# Patient Record
Sex: Female | Born: 1965
Health system: Southern US, Community
[De-identification: ages and names within clinical notes are randomized; demographics above are authoritative.]

## PROBLEM LIST (undated history)

## (undated) DIAGNOSIS — E785 Hyperlipidemia, unspecified: Secondary | ICD-10-CM

## (undated) DIAGNOSIS — E119 Type 2 diabetes mellitus without complications: Secondary | ICD-10-CM

---

## 2003-03-16 ENCOUNTER — Encounter (INDEPENDENT_AMBULATORY_CARE_PROVIDER_SITE_OTHER): Payer: Self-pay

## 2003-03-16 ENCOUNTER — Encounter: Payer: Self-pay | Admitting: Emergency Medicine

## 2003-03-16 ENCOUNTER — Observation Stay (HOSPITAL_COMMUNITY): Admission: AD | Admit: 2003-03-16 | Discharge: 2003-03-16 | Payer: Self-pay | Admitting: Obstetrics and Gynecology

## 2004-11-19 ENCOUNTER — Ambulatory Visit: Payer: Self-pay | Admitting: Sports Medicine

## 2005-01-02 ENCOUNTER — Ambulatory Visit: Payer: Self-pay | Admitting: Family Medicine

## 2005-01-10 ENCOUNTER — Ambulatory Visit (HOSPITAL_COMMUNITY): Admission: RE | Admit: 2005-01-10 | Discharge: 2005-01-10 | Payer: Self-pay | Admitting: Family Medicine

## 2005-01-21 ENCOUNTER — Ambulatory Visit: Payer: Self-pay | Admitting: Family Medicine

## 2005-02-27 ENCOUNTER — Ambulatory Visit: Payer: Self-pay | Admitting: Family Medicine

## 2005-04-25 ENCOUNTER — Ambulatory Visit: Payer: Self-pay | Admitting: Family Medicine

## 2005-07-08 ENCOUNTER — Ambulatory Visit: Payer: Self-pay | Admitting: Family Medicine

## 2005-08-09 ENCOUNTER — Ambulatory Visit: Payer: Self-pay | Admitting: Family Medicine

## 2005-11-07 ENCOUNTER — Encounter (INDEPENDENT_AMBULATORY_CARE_PROVIDER_SITE_OTHER): Payer: Self-pay | Admitting: *Deleted

## 2005-11-21 ENCOUNTER — Ambulatory Visit: Payer: Self-pay | Admitting: Family Medicine

## 2005-11-22 ENCOUNTER — Ambulatory Visit (HOSPITAL_COMMUNITY): Admission: RE | Admit: 2005-11-22 | Discharge: 2005-11-22 | Payer: Self-pay | Admitting: Sports Medicine

## 2005-12-09 ENCOUNTER — Ambulatory Visit: Payer: Self-pay | Admitting: Sports Medicine

## 2005-12-27 ENCOUNTER — Inpatient Hospital Stay (HOSPITAL_COMMUNITY): Admission: AD | Admit: 2005-12-27 | Discharge: 2005-12-27 | Payer: Self-pay | Admitting: Obstetrics & Gynecology

## 2006-01-08 ENCOUNTER — Ambulatory Visit (HOSPITAL_COMMUNITY): Admission: RE | Admit: 2006-01-08 | Discharge: 2006-01-08 | Payer: Self-pay | Admitting: Sports Medicine

## 2006-01-14 ENCOUNTER — Ambulatory Visit: Payer: Self-pay | Admitting: Family Medicine

## 2006-02-14 ENCOUNTER — Ambulatory Visit: Payer: Self-pay | Admitting: Family Medicine

## 2006-02-17 ENCOUNTER — Ambulatory Visit: Payer: Self-pay | Admitting: Family Medicine

## 2006-02-27 ENCOUNTER — Encounter: Admission: RE | Admit: 2006-02-27 | Discharge: 2006-02-27 | Payer: Self-pay | Admitting: Family Medicine

## 2006-03-04 ENCOUNTER — Ambulatory Visit: Payer: Self-pay | Admitting: Family Medicine

## 2006-03-06 ENCOUNTER — Ambulatory Visit (HOSPITAL_COMMUNITY): Admission: RE | Admit: 2006-03-06 | Discharge: 2006-03-06 | Payer: Self-pay | Admitting: Family Medicine

## 2006-03-06 ENCOUNTER — Ambulatory Visit: Payer: Self-pay | Admitting: Family Medicine

## 2006-03-10 ENCOUNTER — Ambulatory Visit: Payer: Self-pay | Admitting: Family Medicine

## 2006-03-17 ENCOUNTER — Ambulatory Visit (HOSPITAL_COMMUNITY): Admission: RE | Admit: 2006-03-17 | Discharge: 2006-03-17 | Payer: Self-pay | Admitting: Obstetrics & Gynecology

## 2006-03-17 ENCOUNTER — Ambulatory Visit: Payer: Self-pay | Admitting: Gynecology

## 2006-03-19 ENCOUNTER — Ambulatory Visit: Payer: Self-pay | Admitting: Family Medicine

## 2006-03-31 ENCOUNTER — Ambulatory Visit: Payer: Self-pay | Admitting: *Deleted

## 2006-04-07 ENCOUNTER — Ambulatory Visit: Payer: Self-pay | Admitting: Obstetrics & Gynecology

## 2006-04-14 ENCOUNTER — Ambulatory Visit: Payer: Self-pay | Admitting: Family Medicine

## 2006-04-17 ENCOUNTER — Ambulatory Visit: Payer: Self-pay | Admitting: Obstetrics and Gynecology

## 2006-04-21 ENCOUNTER — Ambulatory Visit: Payer: Self-pay | Admitting: Family Medicine

## 2006-04-24 ENCOUNTER — Ambulatory Visit: Payer: Self-pay | Admitting: Family Medicine

## 2006-04-28 ENCOUNTER — Ambulatory Visit: Payer: Self-pay | Admitting: Obstetrics & Gynecology

## 2006-05-01 ENCOUNTER — Ambulatory Visit: Payer: Self-pay | Admitting: Family Medicine

## 2006-05-05 ENCOUNTER — Ambulatory Visit: Payer: Self-pay | Admitting: Gynecology

## 2006-05-08 ENCOUNTER — Ambulatory Visit (HOSPITAL_COMMUNITY): Admission: RE | Admit: 2006-05-08 | Discharge: 2006-05-08 | Payer: Self-pay | Admitting: Family Medicine

## 2006-05-08 ENCOUNTER — Ambulatory Visit: Payer: Self-pay | Admitting: Family Medicine

## 2006-05-12 ENCOUNTER — Ambulatory Visit: Payer: Self-pay | Admitting: Family Medicine

## 2006-05-12 ENCOUNTER — Ambulatory Visit (HOSPITAL_COMMUNITY): Admission: RE | Admit: 2006-05-12 | Discharge: 2006-05-12 | Payer: Self-pay | Admitting: Obstetrics & Gynecology

## 2006-05-15 ENCOUNTER — Ambulatory Visit: Payer: Self-pay | Admitting: Family Medicine

## 2006-05-18 ENCOUNTER — Ambulatory Visit: Payer: Self-pay | Admitting: Obstetrics and Gynecology

## 2006-05-18 ENCOUNTER — Inpatient Hospital Stay (HOSPITAL_COMMUNITY): Admission: AD | Admit: 2006-05-18 | Discharge: 2006-05-22 | Payer: Self-pay | Admitting: Obstetrics and Gynecology

## 2006-05-19 ENCOUNTER — Encounter (INDEPENDENT_AMBULATORY_CARE_PROVIDER_SITE_OTHER): Payer: Self-pay | Admitting: *Deleted

## 2006-05-27 ENCOUNTER — Ambulatory Visit: Admission: RE | Admit: 2006-05-27 | Discharge: 2006-05-27 | Payer: Self-pay | Admitting: Obstetrics & Gynecology

## 2006-07-03 ENCOUNTER — Ambulatory Visit: Payer: Self-pay | Admitting: Sports Medicine

## 2006-10-09 ENCOUNTER — Ambulatory Visit: Payer: Self-pay | Admitting: Family Medicine

## 2006-11-11 ENCOUNTER — Ambulatory Visit: Payer: Self-pay | Admitting: Family Medicine

## 2006-12-05 ENCOUNTER — Encounter (INDEPENDENT_AMBULATORY_CARE_PROVIDER_SITE_OTHER): Payer: Self-pay | Admitting: *Deleted

## 2007-01-21 ENCOUNTER — Ambulatory Visit: Payer: Self-pay | Admitting: Family Medicine

## 2007-01-21 DIAGNOSIS — N970 Female infertility associated with anovulation: Secondary | ICD-10-CM

## 2007-02-24 ENCOUNTER — Ambulatory Visit: Payer: Self-pay | Admitting: Family Medicine

## 2007-02-24 DIAGNOSIS — L723 Sebaceous cyst: Secondary | ICD-10-CM

## 2007-03-03 ENCOUNTER — Ambulatory Visit: Payer: Self-pay | Admitting: Family Medicine

## 2007-04-01 ENCOUNTER — Encounter (INDEPENDENT_AMBULATORY_CARE_PROVIDER_SITE_OTHER): Payer: Self-pay | Admitting: Family Medicine

## 2007-04-01 ENCOUNTER — Ambulatory Visit: Payer: Self-pay | Admitting: Family Medicine

## 2007-04-02 ENCOUNTER — Encounter (INDEPENDENT_AMBULATORY_CARE_PROVIDER_SITE_OTHER): Payer: Self-pay | Admitting: Family Medicine

## 2007-04-02 LAB — CONVERTED CEMR LAB: Pap Smear: NORMAL

## 2007-04-06 ENCOUNTER — Encounter (INDEPENDENT_AMBULATORY_CARE_PROVIDER_SITE_OTHER): Payer: Self-pay | Admitting: Family Medicine

## 2007-12-28 ENCOUNTER — Ambulatory Visit: Payer: Self-pay | Admitting: Family Medicine

## 2007-12-28 DIAGNOSIS — N979 Female infertility, unspecified: Secondary | ICD-10-CM | POA: Insufficient documentation

## 2008-04-27 ENCOUNTER — Ambulatory Visit: Payer: Self-pay | Admitting: Family Medicine

## 2008-04-27 ENCOUNTER — Encounter (INDEPENDENT_AMBULATORY_CARE_PROVIDER_SITE_OTHER): Payer: Self-pay | Admitting: Family Medicine

## 2008-04-27 ENCOUNTER — Telehealth (INDEPENDENT_AMBULATORY_CARE_PROVIDER_SITE_OTHER): Payer: Self-pay | Admitting: Family Medicine

## 2008-04-27 LAB — CONVERTED CEMR LAB
Free T4: 1.11 ng/dL (ref 0.89–1.80)
T3, Free: 2.9 pg/mL (ref 2.3–4.2)
TSH: 2.196 microintl units/mL (ref 0.350–4.50)

## 2008-04-28 LAB — CONVERTED CEMR LAB: Pap Smear: NORMAL

## 2008-05-04 ENCOUNTER — Encounter (INDEPENDENT_AMBULATORY_CARE_PROVIDER_SITE_OTHER): Payer: Self-pay | Admitting: Family Medicine

## 2008-05-05 ENCOUNTER — Ambulatory Visit (HOSPITAL_COMMUNITY): Admission: RE | Admit: 2008-05-05 | Discharge: 2008-05-05 | Payer: Self-pay | Admitting: Anesthesiology

## 2008-07-20 ENCOUNTER — Ambulatory Visit: Payer: Self-pay | Admitting: Family Medicine

## 2010-10-28 ENCOUNTER — Encounter: Payer: Self-pay | Admitting: *Deleted

## 2010-11-30 ENCOUNTER — Encounter: Payer: Self-pay | Admitting: *Deleted

## 2011-02-22 NOTE — Discharge Summary (Signed)
NAME:  Carly [name], Carly [name]       ACCOUNT NO.:  [id number]   MEDICAL RECORD NO.:  [id number]          PATIENT TYPE:  WOC   LOCATION:  WOC                          FACILITY:  [facility]   PHYSICIAN:  Deirdre Poe, C.N.M.    DATE OF BIRTH:  03/09/1966   DATE OF ADMISSION:  05/18/2006  DATE OF DISCHARGE:                                 DISCHARGE SUMMARY   ADMISSION DIAGNOSES:  54. A 45 year old term gestation, in labor.  2. Gestational diabetes.   DISCHARGE DIAGNOSES:  1. Primary low transverse cesarean section viable female.  2. Breast engorgement.  3. Normoglycemic.   HISTORY:  This is a 45 year old G2, P-0-0-1-0, presented at 39-3/7th's weeks  by good dating criteria with a scheduled appointment for induction of labor  who had a favorable cervix of 1-2 60% -1, but was not in a labor pattern  contraction.  Fetal heart rate was reassuring.   OBSTETRIC HISTORY:  She was followed at [facility] and high-  risk clinic.  Risk factors were advanced maternal age and A2 gestational  diabetes with good control on Glyburide low dosage.  She is also on prenatal  vitamins.   SIGNIFICANT LABS:  O positive, antibody screen negative, hemoglobin 12.9,  GBS negative, 716.  Her Glucola was over 200, and she was managed with home  CBGs and Glyburide.   Medical, surgical history, family history all noncontributory.   SOCIAL HISTORY:  Nonsmoker.   ADMISSION EXAM:  VITAL SIGNS:  Stable.  BP 120/72,  HEART:  Regular rate and rhythm.  LUNGS:  Clear.  CERVIX:  Exam as above.   HOSPITAL COURSE:  Patient was admitted and begun on Cytotec for cervical  ripening.  She did get further cervical dilatation after that to 2 to 350  and -1, and at this point, membranes were ruptured with clear fluid found.  She was noted, however, to have some variable decelerations, and Dr.  Mia Creek evaluated this, and the decision was made after counseling the  family to proceed with LTCS, due to  non-reassuring fetal heart rate.  The  post-op diagnosis was also inclusive of an occult cord prolapse.  BPL was  500, Apgars were 9, 9 and placenta went to pathology.  Postoperatively, she  did well other than problems with breast feeding.  Her hemoglobin was 46.1  on post-op day 1.  By post-op day 3, she had engorgement particularly of her  right breast and was breast and bottle feeding, as well as pumping.  She had  multiple visits with lactation consultant, and prior to discharge was breast  feeding well.  Her staples were removed on post-op day 3, and she was  discharged home on prenatal vitamin 1 a  day, Micronor 28 to start the next Sunday, ibuprofen 600 p.o. q.6 h.,  Percocet 1-2 q.4-6 h. p.r.n. for pain.  Her followup was to be at [facility] with Dr. Alfonse Ras, who is her primary care physician.  Follow  up in 6 weeks.      Carly [name], C.N.M.     DP/MEDQ  D:  05/22/2006  T:  05/22/2006  Job:  [id number] 

## 2011-02-22 NOTE — Op Note (Signed)
NAME:  Carly [name], Carly [name]                        ACCOUNT NO.:  [id number]   MEDICAL RECORD NO.:  [id number]                   PATIENT TYPE:  AMB   LOCATION:  SDC                                  [facility]  [facility]   PHYSICIAN:  Sherry A. Rosalio Macadamia, M.D.           DATE OF BIRTH:  18-Feb-1966   DATE OF PROCEDURE:  03/16/2003  DATE OF DISCHARGE:                                 OPERATIVE REPORT   PREOPERATIVE DIAGNOSIS:  Ruptured ectopic pregnancy.   POSTOPERATIVE DIAGNOSIS:  Ruptured ectopic pregnancy.   PROCEDURE:  1. Diagnostic open laparoscopy.  2. Removal of left ectopic pregnancy.  3. Left partial salpingectomy.  4. Removal of hemoperitoneum.   SURGEON:  Sherry A. Rosalio Macadamia, M.D.   ANESTHESIA:  General.   INDICATIONS:  This is a 45 year old G2, P-0-0-2-0 woman, who presented to  the emergency room at [facility] with 10 days of bleeding and  severe abdominal pain.  The patient complained of worsening pain over the  prior day, with severe shoulder pain.  The patient was seen in the emergency  room, where she essentially presented in shock with a low blood pressure.  The patient was treated with IV fluids, then treated with two units of  packed red cells.  Ultrasound was performed, which revealed empty uterus  with significant fluid in the abdominal cavity, positive pregnancy test was  present; because of this a diagnosis of ruptured ectopic pregnancy was made.  The patient stabilized very quickly with IV fluids and blood transfusion.  She was therefore transferred to [facility] by [facility].  On admission  at [facility] her blood pressure and pulse were stable.  She was  evaluated and prepared for surgery.   FINDINGS:  Normal sized anteflexed uterus.  Normal ovaries bilaterally.  Normal right fallopian tube.  Left fallopian tube with isthmic 2-3 cm  ectopic pregnancy, partially ruptured with bleeding present.  Large  hemoperitoneum, with approximately 1500 cc  of blood in the abdominal cavity  at time of surgery.   DESCRIPTION OF PROCEDURE:  The patient was brought into the operating room  and given adequate general anesthesia.  She was placed in a dorsal lithotomy  position.  Her abdomen and perineum were washed with Betadine.  The vagina  was washed with Betadine.  A speculum was placed within the vagina.  A  single-toothed Hulka tenaculum was placed in the endometrial cavity in the  anterior fashion.  Speculum was removed.  The surgeon's gown and gloves were  changed.  The patient is draped in a sterile fashion.  Subumbilical area was  incised.  The incision was brought down to the fascia.  The fascia was  grasped with Kocher clamps.  The fascia was incised.  Fascia was  pursestringed with 0 Vicryl suture.  The peritoneum was identified and  entered bluntly.  Hasson trocar was introduced into the peritoneal cavity.  The suprapubic area was infiltrated with  0.5% Marcaine and the incision was  made.  A trocar was placed under direct visualization.  The pelvis was  inspected, with a large amount of blood present (as described above).  The  ectopic was seen easily in the left fallopian tube.  A left lateral mid  incision was made, after 0.5% Marcaine infiltrated, and a 5 mm trocar was  introduced under direct visualization.  Using tripolar cautery, the  fallopian tube was cauterized on either side of the ectopic, and cauterized  across the mesosalpinx.  The cautery and cutting were performed.  Adequate  hemostasis was present.  The specimen was then rested in the cul-de-sac.  Some of the hemoperitoneum was removed for better visualization.  A 5 mm  scope was placed in the suprapubic site.  An Endo catch bag was placed  through the umbilical site.  The specimen was placed in the Endo catch bag  and removed through the umbilical incision.  A regular laparotomy pull  sucker was then placed through the umbilical incision; a large amount of  blood  was removed.  The Hasson was replaced through the umbilical incision,  and the large scope was replaced in the umbilical port.  Using the [place],  the remainder of the hemoperitoneum was removed with some irrigation.  Not  all of the blood could be removed because of some blood clotting and just  difficulty maneuvering around the bowel.  It is estimated that approximately  1500 cc of the blood was removed.  Adequate hemostasis was present and  pictures were obtained.  The left trocar was removed under direct  visualization.  The Hasson was removed, and all carbon dioxide was  encouraged to escape through the umbilical incision in the suprapubic port.  Then the suprapubic sleeve was removed.  The fascia was closed with the  pursestring stitch that was present.  The umbilical incision was then closed  with 4-0 Monocryl in a subcuticular running stitch.  Durabond was placed on  all three incisions.  Adequate hemostasis was present.  A bandage was placed  over the umbilical incision as well.  The Hulka tenaculum was removed.  The  patient was taken out of the dorsal lithotomy position.  She was awakened  and she was moved from the operating room table to a stretcher in stable  condition.   COMPLICATIONS:  None.   ESTIMATED BLOOD LOSS:  1500 cc prior to surgery.                                               Sherry A. Rosalio Macadamia, M.D.    SAD/MEDQ  D:  03/16/2003  T:  03/16/2003  Job:  [id number]

## 2011-02-22 NOTE — Op Note (Signed)
NAME:  Carly [name], Carly [name]       ACCOUNT NO.:  [id number]   MEDICAL RECORD NO.:  [id number]          PATIENT TYPE:  INP   LOCATION:  WOC                           [facility]   PHYSICIAN:  Phil D. Okey Dupre, M.D.     DATE OF BIRTH:  Jun 20, 1966   DATE OF PROCEDURE:  05/19/2006  DATE OF DISCHARGE:                                 OPERATIVE REPORT   PROCEDURE:  Low transverse cesarean section.   PREOPERATIVE DIAGNOSIS:  Nonreassuring fetal heart pattern.   POSTOPERATIVE DIAGNOSIS:  Nonreassuring fetal heart pattern, occult prolapse  of the umbilical cord.   ESTIMATED BLOOD LOSS:  500 mL.   POSTOPERATIVE CONDITION:  Satisfactory.   SPECIMENS TO PATHOLOGY:  The placenta.   SURGEON:  Dr. Elinor Dodge.   ANESTHESIA:  Spinal.   POSTOPERATIVE CONDITION:  Satisfactory.   PROCEDURE:  Under satisfactory spinal anesthesia with the patient in dorsal  supine position, a Foley catheter in urinary bladder, the abdomen was  prepped and draped in usual sterile manner and entered through a  Pfannenstiel incision situated 3 cm above the symphysis pubis and extending  for a total length 14 cm.  The abdomen was entered by layers.  On entering  the peritoneal cavity, the visceral peritoneum and the anterior surface of  the uterus was opened transversely by sharp dissection, the bladder pushed  away the lower uterine segment which was entered by sharp and blunt  dissection and from a ROP presentation, the baby's head was delivered.  At  that point we saw the loops of cord that were caught between the baby's left  shoulder and the neck and head.  Baby was easily delivered.  The cord doubly  clamped, divided, baby handed to pediatrician.  Samples of blood taken from  the cord for analysis.  Placenta spontaneously removed.  The uterus  explored.  The uterus was then closed with continuous running locked 0  Vicryl on an atraumatic needle.  Area was observed for bleeding, none was  noted.  The pelvis was  irrigated and the fascia closed with continuous  running 0 Vicryl on an atraumatic needle.  Subcutaneous bleeders were  controlled with hot cautery.  Skin staples for skin edge approximation.  Dry  sterile dressings applied and the patient transferred to recovery room in  satisfactory condition having tolerated the procedure well.           ______________________________  Javier Glazier. Okey Dupre, M.D.     PDR/MEDQ  D:  05/19/2006  T:  05/19/2006  Job:  [id number]

## 2012-02-11 ENCOUNTER — Ambulatory Visit: Payer: Self-pay | Admitting: Family Medicine

## 2012-03-06 ENCOUNTER — Other Ambulatory Visit (HOSPITAL_COMMUNITY)
Admission: RE | Admit: 2012-03-06 | Discharge: 2012-03-06 | Disposition: A | Discharge status: Home / Self Care | Payer: 59 | Source: Ambulatory Visit | Attending: Family Medicine | Admitting: Family Medicine

## 2012-03-06 ENCOUNTER — Encounter: Payer: Self-pay | Admitting: Family Medicine

## 2012-03-06 ENCOUNTER — Ambulatory Visit (INDEPENDENT_AMBULATORY_CARE_PROVIDER_SITE_OTHER): Payer: 59 | Admitting: Family Medicine

## 2012-03-06 VITALS — BP 122/70 | HR 60 | Temp 98.3°F | Ht 60.6 in | Wt 144.0 lb

## 2012-03-06 DIAGNOSIS — Z124 Encounter for screening for malignant neoplasm of cervix: Secondary | ICD-10-CM | POA: Insufficient documentation

## 2012-03-06 DIAGNOSIS — Z1239 Encounter for other screening for malignant neoplasm of breast: Secondary | ICD-10-CM

## 2012-03-06 DIAGNOSIS — R5383 Other fatigue: Secondary | ICD-10-CM

## 2012-03-06 DIAGNOSIS — Z01419 Encounter for gynecological examination (general) (routine) without abnormal findings: Secondary | ICD-10-CM | POA: Insufficient documentation

## 2012-03-06 DIAGNOSIS — R5381 Other malaise: Secondary | ICD-10-CM

## 2012-03-06 DIAGNOSIS — E781 Pure hyperglyceridemia: Secondary | ICD-10-CM

## 2012-03-06 NOTE — Assessment & Plan Note (Signed)
Patient with generalized body aches and fatigue, mostly affecting shoulders, elbows, somewhat less in wrists.  No edema/erythema or limitations in ROM. She has had some workup with Dr Quintella Reichert on [facility], will request records/labs before engaging in workup. Consider PHQ9 at next visit in 1 month.

## 2012-03-06 NOTE — Patient Instructions (Signed)
Fue un Research officer, trade union. Le hago contacto con el resultado del papanicolau.   Quiero ver los [employer] estudios de Dr Quintella Reichert antes de tomar una decision sobre otros estudios que hay que hacer para el cansancio muscular y los dolores de [place].  Por favor traiga los frascos de todas las medicinas que ha tomado a la proxima cita en un mes.   FOLLOW UP WITH DR Mauricio Po IN 3 to 5 WEEKS>

## 2012-03-06 NOTE — Progress Notes (Signed)
  Subjective:    Patient ID: Carly [name], female    DOB: 1966/10/06, 46 y.o.   MRN: [id number]  HPI Patient comes in to re-establish care.  Visit conducted in Spanish.   Patient with chief complaint of generalized malaise, "dolor de huesos generalizado", mostly affecting bilateral shoulders/upper arms, over several weeks.  She was seen by Dr. Quintella Reichert in [address] and diagnosed with elevated triglycerides (told they were around 500), started on medications for this. Also given an injection which she believes has helped her generalized malaise/achy pain. Associated generalized mild headache pain as well. Has tried to eat less starch and fat in the past 3 weeks, feels better.  Given a syrup that was too strong and made her sleepy after 3 days, so she stopped taking it.   Would like pap smear, last PAP was normal in March 2011 at cancer screening in [facility].  Never had an abnormal PAP.  Mother had cervical cancer.  Patient's LMP April 26, has menses approximately once monthly and believes her next menses is due in next few days.   Family Hx Brother with adult-onset DM.   Review of Systemssee above.      Objective:   Physical Exam Well appearing, no apparent distress.  HEENT Neck supple. Full active ROM neck. No point tenderness over cervical spine, thoracic spine, shoulders, elbows, wrists. No active synovitis in hands bilat. Full symmetric hand grip bilaterally. Palpable radial pulses bilaterally. Full active ROM shoulders and elbows/wrists. Thyroid supple, non-nodular.        Assessment & Plan:

## 2012-03-06 NOTE — Assessment & Plan Note (Signed)
Patient reports she had hypertriglyceridemia diagnosed by labs at Dr [facility] office in April 2013; unspecified medications. Told she does not have DM. Brother with adult-onset DM, no other family members.  She is reducing tortillas from 46 yo 2/day, walking more, eating more fiber, taking fish oil and an unspecified med for cholesterol.  WIll ask her to bring in meds and get lab values before next visit in 1 month.

## 2012-03-10 ENCOUNTER — Encounter: Payer: Self-pay | Admitting: Family Medicine

## 2012-04-03 ENCOUNTER — Ambulatory Visit (INDEPENDENT_AMBULATORY_CARE_PROVIDER_SITE_OTHER): Payer: 59 | Admitting: Family Medicine

## 2012-04-03 ENCOUNTER — Encounter: Payer: Self-pay | Admitting: Family Medicine

## 2012-04-03 VITALS — BP 123/75 | HR 60 | Ht 66.0 in | Wt 140.0 lb

## 2012-04-03 DIAGNOSIS — E781 Pure hyperglyceridemia: Secondary | ICD-10-CM

## 2012-04-03 DIAGNOSIS — R5383 Other fatigue: Secondary | ICD-10-CM

## 2012-04-03 MED ORDER — FENOFIBRATE 160 MG PO TABS: 160.0000 mg | ORAL_TABLET | Freq: Every day | ORAL | Status: DC

## 2012-04-03 NOTE — Progress Notes (Signed)
  Subjective:    Patient ID: Arlisha Patalano, female    DOB: 18-Feb-1966, 46 y.o.   MRN: [id number]  HPI Visit conducted in Spanish.  Patient here for follow up. Says her body aches are almost completely gone.  No more headaches.  Brings in a host of meds that had been given by Dr. Quintella Reichert, not taking any of them now except the fenofibrate 160mg  daily.   She states she is very stressed about situation with her brother, who has taken up with another family and left his wife and their children.  She says, "I assume other people's problems as my own." Feels bad for her brother's children, who "did nothing to deserve this."  Has lost 7 lbs weight by eliminating grease and starch from diet.  Feels very good about this.   Labs from Dr [facility] office (done 02/12/2012): WBC 8.6, Hgb 12.4, plt 279K;  Na+141, K+4.4, Cr 0.76, BUN 18; Glucose 94; Total chol 182, HDL 30, TG 494, Albumin 4.0, AST 14, ALT 24.   Review of Systems Denies chest pain or abdominal pain. No nausea or vomiting.     Objective:   Physical Exam well appearing, no apparent distress         Assessment & Plan:

## 2012-04-03 NOTE — Patient Instructions (Addendum)
Fue un placer verle hoy  Me alegro que esta' mejor desde punto de vista de los dolores corporales y de [place].   Le llamo [phone] con los [place].

## 2012-04-03 NOTE — Assessment & Plan Note (Signed)
Labs from Dr Quintella Reichert reviewed; patient is due for repeat labs today, 8 weeks after starting treatment.  She has lost weight with signifcant dietary changes.  She is given encouragement about this. To contact back by phone with results of the labs.

## 2012-04-03 NOTE — Assessment & Plan Note (Signed)
Much improved; she credits the fibrate medication with this, I would ascribe it mostly to her initiative and success in lifestyle changes.  Either way, to recheck her lipids today, modify treatment as needed. Will assess LDL as she is fasting today (full lipid panel).

## 2012-04-04 LAB — LIPID PANEL
HDL: 47 mg/dL (ref >39)
LDL Cholesterol: 57 mg/dL (ref 0–99)
Total CHOL/HDL Ratio: 2.5 Ratio
Triglycerides: 69 mg/dL (ref <150)

## 2012-04-04 LAB — COMPREHENSIVE METABOLIC PANEL
AST: 17 U/L (ref 0–37)
Alkaline Phosphatase: 46 U/L (ref 39–117)
CO2: 22 mEq/L (ref 19–32)
Calcium: 9.7 mg/dL (ref 8.4–10.5)
Chloride: 104 mEq/L (ref 96–112)
Potassium: 4 mEq/L (ref 3.5–5.3)
Total Protein: 7.1 g/dL (ref 6.0–8.3)

## 2012-04-06 ENCOUNTER — Encounter: Payer: Self-pay | Admitting: Family Medicine

## 2012-05-01 ENCOUNTER — Encounter: Payer: Self-pay | Admitting: Family Medicine

## 2012-05-01 ENCOUNTER — Ambulatory Visit (INDEPENDENT_AMBULATORY_CARE_PROVIDER_SITE_OTHER): Payer: 59 | Admitting: Family Medicine

## 2012-05-01 VITALS — BP 109/69 | HR 58 | Ht 66.0 in | Wt 138.7 lb

## 2012-05-01 DIAGNOSIS — E781 Pure hyperglyceridemia: Secondary | ICD-10-CM

## 2012-05-01 DIAGNOSIS — M722 Plantar fascial fibromatosis: Secondary | ICD-10-CM

## 2012-05-01 MED ORDER — NAPROXEN 500 MG PO TABS: 500.0000 mg | ORAL_TABLET | Freq: Two times a day (BID) | ORAL | Status: AC

## 2012-05-01 NOTE — Patient Instructions (Addendum)
Fue un Research officer, trade union.  Felicidades por la perdida de peso!   Por el colesterol/triglicerios, recomiendo que suspenda la fenofibrate medicina, puede seguir tomando las capsulas de omega-3.  Comience a tomar TUMS 2 tabletas masticables por dia, para suplementos de calcio (recomendacion 1200mg /dia).  Para el dolor de talon izquierdo, use una lata de sopa para estirar los tejidos de la planta del pie antes de poner peso en el pie por la [place].   Usar las plantillas el un zapato con suela gruesa.  Naproxen 500mg  cada 12 horas, con algo de comer, cuando lo necesite.   Llamar si no esta' mejor dentro de 1 a 2 meses.

## 2012-05-01 NOTE — Progress Notes (Signed)
  Subjective:    Patient ID: Carly [name], female    DOB: [birth year], 46 y.o.   MRN: [id number]  HPI Visit in Spanish.  Carly [name] is feeling well.  Is still walking and minding her diet.  Had to suspend her walking regimen because of L heel pain (see below):  L heel pain over the past 1 year, worse lately.  She has worse pain when she gets up in the morning. Gets gradually better in the day.  Wears flip-flops around the house in the daytime. No trauma or injury.  No ankle pain. Does not look red or swollen to her.  No other meds/interventions tried.  Also wants to talk about TGs; they came down from 490 to 69 in a matter of a couple of months, with fenofibrate and weight loss/diet changes.  She is taking omega 3 as well.  Wants to know if she needs the fenofibrate still.  LDL well below 100.   ROS No fevers/chills, body aches of a few visits ago have resolved.  Feels her mood is good, is positive.    Review of Systems See above    Objective:   Physical Exam Well appearing, no apparent distress EXTS: L heel with some mild tenderness medial and lateral aspects, extending along to the insertion of plantar fascia. Full active and passive ROM of ankle, full plantar/dorsiflexion.  No tenderness over achilles tendon on either foot. Palpable dp pulses bilaterally.        Assessment & Plan:

## 2012-05-01 NOTE — Assessment & Plan Note (Signed)
Discussed methods of stretching plantar fascia with soup can as a roller in the morning.  May use Naproxen 500mg  every 12 hours as needed, with food, for pain.  Heel cup given to her for use in supportive shoes (like her tennis shoes).  Poor footwear will compound the problem.  She asks about x-rays-- I do not believe these will be helpful unless she is not having improvement with the interventions outlined above. She is instructed to come back if not better in the coming 1-2 months.

## 2012-05-01 NOTE — Assessment & Plan Note (Signed)
I believe diet and exercise is largely responsible for improvement.  May suspend the fenofibrate for now.  Keep up with diet.  She screening negative for DM (A1C less than 6).  Discussed supplementing with TUMS for calcium, also the walking will help prevent bone demineralization and fragility.  May recheck lipid panel in 2014; no need to recheck sooner unless weight gain, poor diet, etc.

## 2012-05-12 ENCOUNTER — Ambulatory Visit: Payer: 59 | Admitting: Family Medicine

## 2013-06-15 ENCOUNTER — Encounter: Payer: Self-pay | Admitting: Family Medicine

## 2013-06-15 ENCOUNTER — Ambulatory Visit (INDEPENDENT_AMBULATORY_CARE_PROVIDER_SITE_OTHER): Payer: BC Managed Care – PPO | Admitting: Family Medicine

## 2013-06-15 VITALS — BP 112/70 | HR 55 | Temp 98.1°F | Ht 66.0 in | Wt 147.8 lb

## 2013-06-15 DIAGNOSIS — N926 Irregular menstruation, unspecified: Secondary | ICD-10-CM | POA: Insufficient documentation

## 2013-06-15 DIAGNOSIS — Z23 Encounter for immunization: Secondary | ICD-10-CM

## 2013-06-15 DIAGNOSIS — E781 Pure hyperglyceridemia: Secondary | ICD-10-CM

## 2013-06-15 NOTE — Patient Instructions (Addendum)
Fue un Research officer, trade union.   Para la irregularidad de la menstruacion, estoy chequeando la tiroide, la hormona para ver si esta' entrando en la menopausia, y Neurosurgeon.  Perfil lipidico para ver los triglicerios EN AYUNAS.  Le llamo al [phone] (cel) y si el nivel de los triglicerios esta alto, le remito la medicina por la computadora a la [place].   PATIENT NEEDS FASTING LABS, PLANS TO COME IN FASTING AT 830AM TOMORROW.

## 2013-06-16 ENCOUNTER — Other Ambulatory Visit: Payer: BC Managed Care – PPO

## 2013-06-16 DIAGNOSIS — N926 Irregular menstruation, unspecified: Secondary | ICD-10-CM

## 2013-06-16 DIAGNOSIS — E781 Pure hyperglyceridemia: Secondary | ICD-10-CM

## 2013-06-16 LAB — LIPID PANEL
HDL: 38 mg/dL — ABNORMAL LOW (ref >39)
LDL Cholesterol: 97 mg/dL (ref 0–99)
Triglycerides: 126 mg/dL (ref <150)
VLDL: 25 mg/dL (ref 0–40)

## 2013-06-16 NOTE — Progress Notes (Signed)
BMP,FLP AND FSH DONE TODAY Ausha Sieh

## 2013-06-16 NOTE — Progress Notes (Signed)
  Subjective:    Patient ID: Carly [name], female    DOB: Nov 29, 1965, 47 y.o.   MRN: [id number]  HPI Visit conducted in Spanish.  Patient here for health maintenance exam.  She has had elevated TGs in the past (last year had TGs around 490 at Dr [facility] office) which resolved with exercise, diet and weight loss.  Maddyn reports taht she has had increase in her weight by 9 lbs since December, due to lack of exercise and inattention to diet.  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  Is determined to lose the weight.  Has not been on statin therapy for cholesterol since last visit.  Reports skipping her menses on a few recent months.  Started menses yesterday, usually has 2 days of moderate flow.  PMP August 24; skipped the month of July; had menses June 26th.  Mother had surgical menopause (hysterectomy) before natural menopause.   Mammo up to date (January 10, 2005), normal.  Due at age 42.  Family Hx; no family history of colon or breast cancer.  One brother with DM, and another brother with CVA.  No MI history.     ROS: no fevers/chills, no chest pain, no shortness of breath, no abd pain, no constipation or diarrhea, no blood in stool, no dysuria.  No extremity edema.  Feels positive about life.    Review of Systems See above     Objective:   Physical Exam Well appearing, no apparent distress HEENT Neck supple. No cervical adenopathy. No thyroid nodules or tenderness. Clear oropharynx.  COR Regular S1S2, no extra sounds PULM Clear bilaterally, no rales or wheezes ABD Soft, nontender, nondistended. Normal bowel sounds heard.  LEs no peripheral edema noted on exam.  NEURO Gait unremarkable, without assistance.        Assessment & Plan:

## 2013-06-16 NOTE — Assessment & Plan Note (Signed)
Reduced flow and skipping months; most likely perimenopausal.  Will check FSH and TSH today as part of workup (also consider TSH for recent weight gain, h/o hypertriglyceridemia).

## 2013-06-16 NOTE — Assessment & Plan Note (Signed)
To check fasting lipid panel for baseline, now that Carly [name] is off meds and has not been engaging in her usual lifestyle modification.  She is very motivated to resume exercise and diet changes.  Will make further recommendations pending the success of her lifestyle modifications.

## 2013-06-17 ENCOUNTER — Telehealth: Payer: Self-pay | Admitting: Family Medicine

## 2013-06-17 ENCOUNTER — Encounter: Payer: Self-pay | Admitting: Family Medicine

## 2013-06-17 LAB — TSH: TSH: 1.613 u[IU]/mL (ref 0.350–4.500)

## 2013-06-17 NOTE — Telephone Encounter (Signed)
Called patient, call completed in Spanish.  Confirmed results of labs, including the TGs that are normal now (is not on medication for this, had previously had TGs 490 at Dr [facility] office last year).  Results sent to her by letter as well. Paula Compton, MD

## 2014-06-22 ENCOUNTER — Ambulatory Visit (INDEPENDENT_AMBULATORY_CARE_PROVIDER_SITE_OTHER): Payer: BC Managed Care – PPO | Admitting: Family Medicine

## 2014-06-22 ENCOUNTER — Encounter: Payer: Self-pay | Admitting: Family Medicine

## 2014-06-22 ENCOUNTER — Other Ambulatory Visit (HOSPITAL_COMMUNITY)
Admission: RE | Admit: 2014-06-22 | Discharge: 2014-06-22 | Disposition: A | Discharge status: Home / Self Care | Payer: BC Managed Care – PPO | Source: Ambulatory Visit | Attending: Family Medicine | Admitting: Family Medicine

## 2014-06-22 VITALS — BP 126/72 | HR 56 | Temp 98.2°F | Wt 150.0 lb

## 2014-06-22 DIAGNOSIS — N760 Acute vaginitis: Secondary | ICD-10-CM | POA: Diagnosis not present

## 2014-06-22 DIAGNOSIS — B9689 Other specified bacterial agents as the cause of diseases classified elsewhere: Secondary | ICD-10-CM | POA: Diagnosis not present

## 2014-06-22 DIAGNOSIS — N898 Other specified noninflammatory disorders of vagina: Secondary | ICD-10-CM | POA: Diagnosis not present

## 2014-06-22 DIAGNOSIS — B373 Candidiasis of vulva and vagina: Secondary | ICD-10-CM | POA: Diagnosis not present

## 2014-06-22 DIAGNOSIS — B3731 Acute candidiasis of vulva and vagina: Secondary | ICD-10-CM

## 2014-06-22 DIAGNOSIS — B379 Candidiasis, unspecified: Secondary | ICD-10-CM | POA: Insufficient documentation

## 2014-06-22 DIAGNOSIS — Z113 Encounter for screening for infections with a predominantly sexual mode of transmission: Secondary | ICD-10-CM | POA: Insufficient documentation

## 2014-06-22 DIAGNOSIS — A499 Bacterial infection, unspecified: Secondary | ICD-10-CM

## 2014-06-22 LAB — POCT WET PREP (WET MOUNT): CLUE CELLS WET PREP WHIFF POC: POSITIVE

## 2014-06-22 MED ORDER — FLUCONAZOLE 150 MG PO TABS: 150.0000 mg | ORAL_TABLET | Freq: Once | ORAL | Status: DC

## 2014-06-22 MED ORDER — METRONIDAZOLE 500 MG PO TABS: 500.0000 mg | ORAL_TABLET | Freq: Two times a day (BID) | ORAL | Status: DC

## 2014-06-22 NOTE — Patient Instructions (Addendum)
We will treat you for a Yeast infection, which is a common infection that can occur in women. It is not transmitted between partners.    The treatment we will give you is a single pill called Diflucan that you take once. In the future you can try the over the counter medications which are called Monistat and contain the medications Miconazole or Ticonazole. They come in different formulations that differ by the length of time you take them.  You will get a letter in the mail if your results are negative. If they are positive, someone from clinic will call you with directions on what needs to be done.  You may be going through menopause as well since you have not had your period in several months. It is not unexpected to have your period intermittently, so it may come back. Once it is gone for 12 months we say that you have gone through menopause.

## 2014-06-22 NOTE — Assessment & Plan Note (Signed)
Moderate clues on wet prep. Plan: flagyl  BID x 7 days.

## 2014-06-22 NOTE — Assessment & Plan Note (Signed)
Hyphae and spores on wet prep. Plan: diflucan now, repeat after flagyl course. Also collected GC/chlamydia.

## 2014-06-22 NOTE — Progress Notes (Signed)
Patient ID: Carly [name], female   DOB: 10-Feb-1966, 48 y.o.   MRN: [id number]   Subjective:    Patient ID: Carly [name], female    DOB: 23-Nov-1965, 48 y.o.   MRN: [id number]  HPI  CC: vaginal infection  # Vaginal infection:  Symptoms started 1 week ago  Primarily vaginal itchiness. Small amount of discharge.  LMP in May (this is contrary to what is documented at visit 1 week ago)  Has history of infection in the past in [place] about 5 years ago, states it was "passed between" her husband and her.  Tried vagisil OTC cream with small amount of relief  Sexually active with husband only, no contraception.  No dysuria, incontinence, fevers, chills  Review of Systems   See HPI for ROS. All other systems reviewed and are negative.  Past medical history, surgical, family, and social history reviewed and updated in the EMR. No new updates were made today. Objective:  BP 126/72  Pulse 56  Temp(Src) 98.2 F (36.8 C) (Oral)  Wt 150 lb (68.04 kg)  LMP 02/19/2014 Vitals reviewed  General: NAD GU: normal external genitalia, no evidence of atrophy, NAVM. Scant amount of discharge in vault, no blood.  Assessment & Plan:  See Problem List Documentation

## 2014-06-24 ENCOUNTER — Encounter: Payer: Self-pay | Admitting: Family Medicine

## 2014-08-19 ENCOUNTER — Encounter: Payer: Self-pay | Admitting: Family Medicine

## 2014-08-19 ENCOUNTER — Ambulatory Visit (INDEPENDENT_AMBULATORY_CARE_PROVIDER_SITE_OTHER): Payer: BC Managed Care – PPO | Admitting: Family Medicine

## 2014-08-19 VITALS — BP 140/84 | HR 50 | Temp 97.5°F | Ht 66.0 in | Wt 149.8 lb

## 2014-08-19 DIAGNOSIS — E781 Pure hyperglyceridemia: Secondary | ICD-10-CM | POA: Diagnosis not present

## 2014-08-19 DIAGNOSIS — K921 Melena: Secondary | ICD-10-CM | POA: Diagnosis not present

## 2014-08-19 DIAGNOSIS — N926 Irregular menstruation, unspecified: Secondary | ICD-10-CM

## 2014-08-19 LAB — CBC
HCT: 40.2 % (ref 36.0–46.0)
Hemoglobin: 13.5 g/dL (ref 12.0–15.0)
MCH: 29.5 pg (ref 26.0–34.0)
MCHC: 33.6 g/dL (ref 30.0–36.0)
MCV: 88 fL (ref 78.0–100.0)
PLATELETS: 286 10*3/uL (ref 150–400)
RBC: 4.57 MIL/uL (ref 3.87–5.11)
RDW: 13.1 % (ref 11.5–15.5)
WBC: 6.4 10*3/uL (ref 4.0–10.5)

## 2014-08-19 LAB — LIPID PANEL
Cholesterol: 146 mg/dL (ref 0–200)
HDL: 35 mg/dL — ABNORMAL LOW (ref >39)
LDL Cholesterol: 82 mg/dL (ref 0–99)
Total CHOL/HDL Ratio: 4.2 Ratio
Triglycerides: 146 mg/dL (ref <150)
VLDL: 29 mg/dL (ref 0–40)

## 2014-08-19 NOTE — Patient Instructions (Signed)
Fue un Research officer, trade unionplacer verle hoy.    Estamos chequeando los laboratorios para el colesterol/triglicerios, tambien el conteo de sangre y la hormona para ver si esta' entrando en la menopausia.   Gastroenterologia para evaluar la sangre que vio en la materia fecal.   FOLLOW UP WITH DR Mauricio PoBREEN IN 2 MONTHS.

## 2014-08-19 NOTE — Progress Notes (Signed)
graciella was used as Research officer, trade unionspanish interpreter during this visit. Paulino Cork CMA

## 2014-08-20 LAB — FOLLICLE STIMULATING HORMONE: FSH: 22.1 m[IU]/mL

## 2014-08-22 ENCOUNTER — Encounter: Payer: Self-pay | Admitting: Family Medicine

## 2014-08-22 ENCOUNTER — Telehealth: Payer: Self-pay | Admitting: Family Medicine

## 2014-08-22 NOTE — Telephone Encounter (Signed)
Attempted to call patient to report lab results. Recording "number is incorrect" after two separate attempts.  Will send letter to patient.  JB

## 2014-08-22 NOTE — Assessment & Plan Note (Signed)
Patient with LMP Nov 6-10th; previous menses in May.  She says that her menses have spaced out more in the past year. Suspect entering menopause/perimenopause. To check FSH to confirm.

## 2014-08-22 NOTE — Assessment & Plan Note (Signed)
Red blood per rectum in late October 2015 for 4 days. No explanation on physical exam today. Referral to GI for consideration of endoscopic exam; she is turning 49 in 4 months, and would have indication for screening colonoscopy at that time.  This referral is for diagnostic reasons.

## 2014-08-22 NOTE — Progress Notes (Signed)
   Subjective:    Patient ID: Jones BroomMaria Garcia, female    DOB: 12/23/1965, 48 y.o.   MRN: [id number]  HPI Visit conducted in Spanish.  Patient here for physical exam.  She has a history of hypertriglyceridemia which she has controlled in the past with diet and exercise.  She has not been exercising (walking) for the past couple of months; also has lapsed on her previous dietary regimen.   She reports that she has felt well otherwise.   Reports seeing red blood around her stool for 3 days a couple of weeks ago.  Was not painful to have BM; she did not have to strain.  Does not suffer from constipation or diarrhea. No recurrence of the blood since then. LMP Nov 6-10th, did not correlate with the timing of the blood per rectum. No dysuria or hematuria.  She received a flu shot at the pharmacy this fall.   Social Hx; Nonsmoker. No alcohol. Lives with husband and son.   Reviewed patient's preventive flowsheet. Is UTD on cervical cytology.  Family Hx; No family hx of breast or colon cancer; mother had uterine cancer and had hysterectomy/oophorectomy.   Review of Systems No fevers or chills, no chest pain, no dyspnea or cough, no abdominal pain; no history of bleeding her rectum, no history of hemorrhoids or fissues. no N/V/D; LMP in November lasted 4 days (her usual); penultimate menses in May, also for 4 days. Reports feeling of nuchal 'tightness/heaviness' that extends to both shoulders from time to time and is not improved with Tylenol or ibuprofen.      Objective:   Physical Exam Well appearing, no apparent distress HEENT Neck supple, no cervical adenopathy. Moist mucus membranes.  COR Regular S1S2, no extra sounds PULM Clear bilaterally, no rales or wheezes ABD Soft, nontender, nondistended. No masses noted.  DRE No fissures, external/internal hemorrhoids noted. No masses. Trace brown stool without visible evidence of bleeding. Not guaiac tested.Good rectal tone.        Assessment & Plan:

## 2014-08-22 NOTE — Assessment & Plan Note (Signed)
Patient with hypertriglyceridemia, previously controlled with diet and exercise. She admits that she has fallen away from her previous regimen of exercise and dietary discretion.  To recheck today in order to establish her baseline.  She plans to go back to her walking regimen and establish a more healthy diet.

## 2014-08-24 ENCOUNTER — Telehealth: Payer: Self-pay | Admitting: Family Medicine

## 2015-09-06 ENCOUNTER — Encounter: Payer: Self-pay | Admitting: Family Medicine

## 2015-09-06 ENCOUNTER — Other Ambulatory Visit (HOSPITAL_COMMUNITY)
Admission: RE | Admit: 2015-09-06 | Discharge: 2015-09-06 | Disposition: A | Discharge status: Home / Self Care | Payer: BLUE CROSS/BLUE SHIELD | Source: Ambulatory Visit | Attending: Family Medicine | Admitting: Family Medicine

## 2015-09-06 ENCOUNTER — Ambulatory Visit (INDEPENDENT_AMBULATORY_CARE_PROVIDER_SITE_OTHER): Payer: BLUE CROSS/BLUE SHIELD | Admitting: Family Medicine

## 2015-09-06 VITALS — BP 129/68 | HR 67 | Temp 98.2°F | Ht 66.0 in | Wt 149.9 lb

## 2015-09-06 DIAGNOSIS — Z124 Encounter for screening for malignant neoplasm of cervix: Secondary | ICD-10-CM | POA: Diagnosis not present

## 2015-09-06 DIAGNOSIS — Z01419 Encounter for gynecological examination (general) (routine) without abnormal findings: Secondary | ICD-10-CM

## 2015-09-06 DIAGNOSIS — M722 Plantar fascial fibromatosis: Secondary | ICD-10-CM | POA: Diagnosis not present

## 2015-09-06 DIAGNOSIS — E781 Pure hyperglyceridemia: Secondary | ICD-10-CM | POA: Diagnosis not present

## 2015-09-06 DIAGNOSIS — Z0000 Encounter for general adult medical examination without abnormal findings: Secondary | ICD-10-CM

## 2015-09-06 DIAGNOSIS — Z23 Encounter for immunization: Secondary | ICD-10-CM | POA: Diagnosis not present

## 2015-09-06 DIAGNOSIS — Z1151 Encounter for screening for human papillomavirus (HPV): Secondary | ICD-10-CM | POA: Insufficient documentation

## 2015-09-06 LAB — COMPREHENSIVE METABOLIC PANEL
ALT: 18 U/L (ref 6–29)
AST: 18 U/L (ref 10–35)
Albumin: 4.2 g/dL (ref 3.6–5.1)
Alkaline Phosphatase: 111 U/L (ref 33–130)
BUN: 11 mg/dL (ref 7–25)
CHLORIDE: 101 mmol/L (ref 98–110)
CO2: 27 mmol/L (ref 20–31)
Calcium: 9.3 mg/dL (ref 8.6–10.4)
Creat: 0.61 mg/dL (ref 0.50–1.05)
GLUCOSE: 94 mg/dL (ref 65–99)
POTASSIUM: 3.9 mmol/L (ref 3.5–5.3)
Sodium: 137 mmol/L (ref 135–146)
Total Bilirubin: 0.8 mg/dL (ref 0.2–1.2)
Total Protein: 7.3 g/dL (ref 6.1–8.1)

## 2015-09-06 LAB — LIPID PANEL
CHOL/HDL RATIO: 4.9 ratio (ref <=5.0)
CHOLESTEROL: 181 mg/dL (ref 125–200)
HDL: 37 mg/dL — ABNORMAL LOW (ref >=46)
LDL Cholesterol: 115 mg/dL (ref <130)
Triglycerides: 147 mg/dL (ref <150)
VLDL: 29 mg/dL (ref <30)

## 2015-09-06 NOTE — Patient Instructions (Signed)
See me in about 6 weeks and we will follow up on your heel pain and see if you want me to set up a colonoscopy Great to meet you!

## 2015-09-06 NOTE — Assessment & Plan Note (Signed)
Pap smear today. 

## 2015-09-06 NOTE — Assessment & Plan Note (Signed)
Different formation on mammogram and a handout on colonoscopy. She will review the material on colonoscopy and decide if she wants me to set that up. If she does show kidney a call or see me back. She wanted some lab work done today as well as a breast exam which I did. Pap smear obtained. Flu shot given.

## 2015-09-06 NOTE — Assessment & Plan Note (Signed)
Recurrence of mild left plantar fasciitis. Gave her handout and Spanish. Also discussed stretching with frozen water bottle. She's not having some improvement in 4-6 weeks she'll return for further management.

## 2015-09-06 NOTE — Progress Notes (Signed)
   Subjective:    Patient ID: Carly [name], female    DOB: 06/23/1966, 49 y.o.   MRN: [id number]  HPI This visit was conducted with the use of the Spanish interpreter. Patient is here for well woman gynecological care. Last 2 Pap smears have been normal. She is having menstrual cycles very irregularly. She's had 2 in the last 6 months. Having some left heel pain. Has had this before and it resolved with conservative care. Hurts worse first thing in the morning.   Review of Systems  Constitutional: Negative for activity change, appetite change, fatigue and unexpected weight change.  HENT: Negative for ear pain.   Eyes: Negative for pain and visual disturbance.  Respiratory: Negative for cough and shortness of breath.   Cardiovascular: Negative for chest pain, palpitations and leg swelling.  Gastrointestinal: Negative for nausea, diarrhea and constipation.  Genitourinary: Negative for pelvic pain.  Musculoskeletal: Negative for joint swelling.  Neurological: Negative for light-headedness.  Psychiatric/Behavioral: Negative for confusion, sleep disturbance and dysphoric mood. The patient is not nervous/anxious.        Objective:   Physical Exam  Constitutional: She is oriented to person, place, and time. She appears well-developed and well-nourished.  HENT:  Right Ear: External ear normal.  Left Ear: External ear normal.  Eyes: EOM are normal. Pupils are equal, round, and reactive to light. No scleral icterus.  Neck: Normal range of motion. Neck supple. No thyromegaly present.  Cardiovascular: Normal rate and normal heart sounds.   Pulmonary/Chest: Effort normal and breath sounds normal.  Abdominal: Bowel sounds are normal. She exhibits no distension. There is no tenderness.  Genitourinary: Vagina normal and uterus normal. No vaginal discharge found.  Musculoskeletal: Normal range of motion.  Neurological: She is alert and oriented to person, place, and time. She has normal  reflexes.  Skin: Skin is warm and dry. No rash noted.  Psychiatric: She has a normal mood and affect. Her behavior is normal. Judgment and thought content normal.   BREASTS: Bilaterally symmetrical. Scattered fibrocystic type changes, no worrisome masses. No tenderness. No skin changes. Nipple and Arriola are normal. GU: Externally normal without lesion. Cervix is normal. No adnexal masses or tenderness. Uterus is normal position and size.        Assessment & Plan:

## 2015-09-06 NOTE — Assessment & Plan Note (Signed)
Lipid panel today

## 2015-09-07 LAB — CYTOLOGY - PAP

## 2015-09-19 ENCOUNTER — Encounter: Payer: Self-pay | Admitting: Family Medicine

## 2015-10-11 ENCOUNTER — Ambulatory Visit (INDEPENDENT_AMBULATORY_CARE_PROVIDER_SITE_OTHER): Payer: BLUE CROSS/BLUE SHIELD | Admitting: Family Medicine

## 2015-10-11 ENCOUNTER — Encounter: Payer: Self-pay | Admitting: Family Medicine

## 2015-10-11 VITALS — BP 125/63 | HR 66 | Temp 98.2°F | Ht 66.0 in | Wt 148.2 lb

## 2015-10-11 DIAGNOSIS — Z800 Family history of malignant neoplasm of digestive organs: Secondary | ICD-10-CM

## 2015-10-11 NOTE — Progress Notes (Signed)
   Subjective:    Patient ID: Carly [name], female    DOB: 09/05/1966, 50 y.o.   MRN: [id number]  HPI Video interpreter used for office visit Here for discussion about colonoscopy. Just found out her mom was diagnosed with colon cancer. Her mom is scheduled for biopsy sometime next week. She doesn't know much other details except that they may have found some spots on her lung; her mom is in her 4870s. #2. Follow-up on her laboratory results. She received the letter but has some questions   Review of Systems No unusual weight change. No blood in the stool.    Objective:   Physical Exam  Vital signs are reviewed GEN.: Well-developed female no acute distress      Assessment & Plan:  New diagnosis of family history: Cancer. I discussed colonoscopy with her at length. We will give her referral information to have that set up. #2. Reviewed her laboratory results with her all of which were normal.

## 2017-02-28 ENCOUNTER — Ambulatory Visit: Payer: Self-pay

## 2017-12-30 ENCOUNTER — Other Ambulatory Visit: Payer: Self-pay | Admitting: Obstetrics and Gynecology

## 2017-12-30 DIAGNOSIS — Z1231 Encounter for screening mammogram for malignant neoplasm of breast: Secondary | ICD-10-CM

## 2018-01-15 ENCOUNTER — Encounter (HOSPITAL_COMMUNITY): Payer: Self-pay | Admitting: *Deleted

## 2018-01-15 ENCOUNTER — Ambulatory Visit (HOSPITAL_COMMUNITY)
Admission: RE | Admit: 2018-01-15 | Discharge: 2018-01-15 | Disposition: A | Discharge status: Home / Self Care | Payer: Self-pay | Source: Ambulatory Visit | Attending: Obstetrics and Gynecology | Admitting: Obstetrics and Gynecology

## 2018-01-15 ENCOUNTER — Ambulatory Visit
Admission: RE | Admit: 2018-01-15 | Discharge: 2018-01-15 | Disposition: A | Discharge status: Home / Self Care | Payer: No Typology Code available for payment source | Source: Ambulatory Visit | Attending: Obstetrics and Gynecology | Admitting: Obstetrics and Gynecology

## 2018-01-15 VITALS — BP 110/80 | Wt 148.4 lb

## 2018-01-15 DIAGNOSIS — Z1239 Encounter for other screening for malignant neoplasm of breast: Secondary | ICD-10-CM

## 2018-01-15 DIAGNOSIS — Z1231 Encounter for screening mammogram for malignant neoplasm of breast: Secondary | ICD-10-CM

## 2018-01-15 NOTE — Patient Instructions (Signed)
Explained breast self awareness with Carly GlassingMaria [name]. Patient did not need a Pap smear today due to last Pap smear and HPV typing was 09/06/2015. Let her know BCCCP will cover Pap smears and HPV typing every 5 years unless has a history of abnormal Pap smears. [facility]. Appointment scheduled for Thursday, January 15, 2018 at 1410. Let patient know the Breast Center will follow up with her within the next couple weeks with results of mammogram by letter or phone. Carly GlassingMaria [name] verbalized understanding.  Tyriek Hofman, Kathaleen Maserhristine Poll, RN 2:14 PM

## 2018-01-15 NOTE — Progress Notes (Signed)
No complaints today.   Pap Smear: Pap smear not completed today. Last Pap smear was 09/06/2015 [facility] and normal with negative HPV. Per patient has no history of an abnormal Pap smear. Last Pap smear result is in Epic.  Physical exam: Breasts Breasts symmetrical. No skin abnormalities bilateral breasts. No nipple retraction bilateral breasts. No nipple discharge bilateral breasts. No lymphadenopathy. No lumps palpated bilateral breasts. No complaints of pain or tenderness on exam. [facility] for a screening mammogram. Appointment scheduled for Thursday, January 15, 2018 at 1410.        Pelvic/Bimanual No Pap smear completed today since last Pap smear and HPV typing was 09/06/2015. Pap smear not indicated per BCCCP guidelines.   Smoking History: Patient has never smoked.  Patient Navigation: Patient education provided. Access to services provided for [facility] through [facility]. Spanish interpreter provided.   Colorectal Cancer Screening: Per patient has never had a colonoscopy completed. No complaints today. FIT Test given to patient to complete and return to BCCCP.  Breast and Cervical Cancer Risk Assessment: Patient has no family history of breast cancer, known genetic mutations, or radiation treatment to the chest before age 53. Patient has no history of cervical dysplasia, immunocompromised, or DES exposure in-utero.  Used Spanish interpreter [employer]rika McReynolds from [place].

## 2018-01-17 ENCOUNTER — Other Ambulatory Visit: Payer: Self-pay

## 2018-01-19 ENCOUNTER — Other Ambulatory Visit: Payer: Self-pay | Admitting: Obstetrics and Gynecology

## 2018-01-19 ENCOUNTER — Other Ambulatory Visit: Payer: Self-pay

## 2018-01-19 DIAGNOSIS — R928 Other abnormal and inconclusive findings on diagnostic imaging of breast: Secondary | ICD-10-CM

## 2018-01-21 ENCOUNTER — Ambulatory Visit
Admission: RE | Admit: 2018-01-21 | Discharge: 2018-01-21 | Disposition: A | Discharge status: Home / Self Care | Payer: No Typology Code available for payment source | Source: Ambulatory Visit | Attending: Obstetrics and Gynecology | Admitting: Obstetrics and Gynecology

## 2018-01-21 ENCOUNTER — Other Ambulatory Visit: Payer: Self-pay | Admitting: Obstetrics and Gynecology

## 2018-01-21 DIAGNOSIS — N6320 Unspecified lump in the left breast, unspecified quadrant: Secondary | ICD-10-CM

## 2018-01-21 DIAGNOSIS — R928 Other abnormal and inconclusive findings on diagnostic imaging of breast: Secondary | ICD-10-CM

## 2018-01-21 LAB — CYTOLOGY - PAP: DIAGNOSIS: NEGATIVE

## 2018-01-23 LAB — FECAL OCCULT BLOOD, IMMUNOCHEMICAL: FECAL OCCULT BLD: NEGATIVE

## 2018-01-26 ENCOUNTER — Encounter (HOSPITAL_COMMUNITY): Payer: Self-pay

## 2018-01-26 ENCOUNTER — Encounter (HOSPITAL_COMMUNITY): Payer: Self-pay | Admitting: *Deleted

## 2018-03-20 ENCOUNTER — Inpatient Hospital Stay: Payer: No Typology Code available for payment source | Attending: Obstetrics and Gynecology | Admitting: *Deleted

## 2018-03-20 ENCOUNTER — Inpatient Hospital Stay: Payer: No Typology Code available for payment source

## 2018-03-20 ENCOUNTER — Other Ambulatory Visit (HOSPITAL_COMMUNITY): Payer: Self-pay | Admitting: *Deleted

## 2018-03-20 VITALS — BP 118/72 | Ht 66.0 in | Wt 149.2 lb

## 2018-03-20 DIAGNOSIS — Z0000 Encounter for general adult medical examination without abnormal findings: Secondary | ICD-10-CM | POA: Insufficient documentation

## 2018-03-20 LAB — LIPID PANEL
CHOLESTEROL: 172 mg/dL (ref 0–200)
HDL: 37 mg/dL — ABNORMAL LOW (ref >40)
LDL Cholesterol: 104 mg/dL — ABNORMAL HIGH (ref 0–99)
Total CHOL/HDL Ratio: 4.6 RATIO
Triglycerides: 157 mg/dL — ABNORMAL HIGH (ref <150)
VLDL: 31 mg/dL (ref 0–40)

## 2018-03-20 LAB — HEMOGLOBIN A1C
HEMOGLOBIN A1C: 7.2 % — AB (ref 4.8–5.6)
Mean Plasma Glucose: 159.94 mg/dL

## 2018-03-20 NOTE — Progress Notes (Signed)
Wisewoman initial screening  Spanish Interpreter- Denman GeorgeLaura Ibarra  Clinical Measurement:  Height:  66in.  Weight: 149.2lb  Blood Pressure: 110/70  Blood Pressure #2: 118/72  Fasting Labs Drawn Today, will review with patient when they result.  Medical History:  Patient states that she has not been diagnosed with high cholesterol, high blood pressure, diabetes or heart disease.  Medications:  Patients states she is not taking any medications for high cholesterol, high blood pressure or diabetes.  She is not taking aspirin daily to prevent heart attack or stroke.   Blood pressure, self measurement:  Patients states she does not measure blood pressure at home.  Nutrition:  Patient states she eats  0.5 cups of fruit and 0.5cups  of vegetables in an average day.  Patient states she does not eat fish regularly, she does not eat more than half a serving of whole grains daily. She drinks less than 36 ounces of beverages with added sugar weekly.  She is currently watching her sodium intake.  She has not had any drinks containing alcohol in the last seven days.   Physical activity:  Patient states that she gets 300 minutes of moderate exercise in a week.  She gets 0 minutes of vigorous exercise per week.    Smoking status:  Patient states she has never smoked and is not around any smokers.       Risk reduction and counseling:  Patient states she wants to  increase fruit and vegetable intake.  I encouraged her to continue with current exercise regimen and increase vegetable , fruit intake and increase water consumption.  Navigation:  I will notify patient of lab results.  Patient is aware of 2 more health coaching sessions and a follow up.

## 2018-03-23 ENCOUNTER — Telehealth (HOSPITAL_COMMUNITY): Payer: Self-pay | Admitting: *Deleted

## 2018-03-23 NOTE — Telephone Encounter (Signed)
Health Coach 2  [employer] Interpreter  Labs- Fasting lab results- Cholesterol 172, triglycerides 157, HDL 37, LDL cholesterol 104, hemoglobin A1C 7.2, glucose 159.94  Patient is aware and understands these labs.  Goals- Patient states she will increase her consumption of fruits, vegatables and water.  She states she will reduce her consumption of fats and states she will increase her  exercise regimen.  Navigation:  [facility].  Patient is aware of 1 more health coaching sessions and a follow up.

## 2018-03-26 ENCOUNTER — Ambulatory Visit (INDEPENDENT_AMBULATORY_CARE_PROVIDER_SITE_OTHER): Payer: Self-pay | Admitting: Internal Medicine

## 2018-03-26 ENCOUNTER — Other Ambulatory Visit: Payer: Self-pay

## 2018-03-26 ENCOUNTER — Encounter: Payer: Self-pay | Admitting: Internal Medicine

## 2018-03-26 DIAGNOSIS — Z8632 Personal history of gestational diabetes: Secondary | ICD-10-CM

## 2018-03-26 DIAGNOSIS — Z79899 Other long term (current) drug therapy: Secondary | ICD-10-CM

## 2018-03-26 DIAGNOSIS — Z7984 Long term (current) use of oral hypoglycemic drugs: Secondary | ICD-10-CM

## 2018-03-26 DIAGNOSIS — Z833 Family history of diabetes mellitus: Secondary | ICD-10-CM

## 2018-03-26 DIAGNOSIS — E785 Hyperlipidemia, unspecified: Secondary | ICD-10-CM

## 2018-03-26 DIAGNOSIS — E119 Type 2 diabetes mellitus without complications: Secondary | ICD-10-CM

## 2018-03-26 DIAGNOSIS — E663 Overweight: Secondary | ICD-10-CM

## 2018-03-26 DIAGNOSIS — Z6829 Body mass index (BMI) 29.0-29.9, adult: Secondary | ICD-10-CM

## 2018-03-26 DIAGNOSIS — E1169 Type 2 diabetes mellitus with other specified complication: Secondary | ICD-10-CM

## 2018-03-26 MED ORDER — PRAVASTATIN SODIUM 40 MG PO TABS: 40.0000 mg | ORAL_TABLET | Freq: Every day | ORAL | 2 refills | Status: DC

## 2018-03-26 MED ORDER — METFORMIN HCL 500 MG PO TABS: 500.0000 mg | ORAL_TABLET | Freq: Two times a day (BID) | ORAL | 2 refills | Status: DC

## 2018-03-26 NOTE — Patient Instructions (Signed)
[name],  The pleasure meeting you today.  I am going to start you on a cholesterol medication called pravastatin. This will be an once a day medication.  The other medication I would like you to start on the for the first week and when she did take 1 pill in the morning with breakfast.  If you do okay with this dose and do not have any bad side effects the following week you can take 1 pill in the morning with breakfast and another pill at night with dinner.  Please follow-up with our financial counselor and get the orange card.  I want you to come back to see [place] in clinic in 1 month.   El Brewing technologist. Voy a comenzar con un medicamento para el colesterol llamado pravastatina. Esta ser Carly [name] medicacin de una vez al da.  La otra medicacin que me gustara que comience la primera semana y cuando ella tome 1 pastilla por la maana con el desayuno. Si est de acuerdo con esta dosis y no tiene Water engineer secundario adverso la semana siguiente, puede tomar 1 pastilla por la maana con el desayuno y otra por la noche con la cena.  Por favor haga un seguimiento con nuestro asesor financiero y [place] la tarjeta naranja. Quiero que vuelvas a vernos a Conservation officer, historic buildings 1 mes.  Metformin tablets Qu es este medicamento? La METFORMINA se [place] para tratar la diabetes tipo 2. Ayuda a Advice worker de Dispensing optician. El tratamiento se [place] con ejercicios y [place]. Este [employer] se puede usar solo o con otros medicamentos para la diabetes, incluyendo la insulina. Este medicamento puede ser utilizado para otros usos; si tiene alguna pregunta consulte con su proveedor de atencin mdica o con su farmacutico. MARCAS COMUNES: Glucophage Qu le debo informar a mi profesional de la salud antes de tomar este medicamento? Necesita saber si usted presenta alguno de los siguientes problemas o situaciones: -anemia -si consume bebidas alcohlicas con frecuencia -se deshidrata  con facilidad -ataque cardiaco -insuficiencia cardiaca tratada con medicamentos -enfermedad renal -enfermedad heptica -ovarios poliqusticos -infeccin o lesin severa -vmito -una reaccin alrgica o inusual a la metformina, a otros medicamentos, alimentos, colorantes o conservantes -si est embarazada o buscando quedar embarazada -si est amamantando a un beb Cmo debo utilizar este medicamento? Tome este medicamento por va oral. Tmelo con alimentos. Trague las tabletas con agua. Siga las instrucciones de la etiqueta del Economy. Tome su medicamento a intervalos regulares. No tome su medicamento con una frecuencia mayor a la indicada. Hable con su pediatra para informarse acerca del uso de este medicamento en nios. Aunque este medicamento se puede recetar a nios tan pequeos como de 10 aos de edad en casos selectos, existen precauciones que deben tomarse. Sobredosis: Pngase en contacto inmediatamente con un centro toxicolgico o una sala de urgencia si usted cree que haya tomado demasiado medicamento. ATENCIN: [employer] es solo para usted. No comparta este medicamento con nadie. Qu sucede si me olvido de una dosis? Si olvida una dosis, tmela lo antes posible. Si es casi la hora de su dosis siguiente, tome slo esa dosis. No tome dosis adicionales o dobles. Qu puede interactuar con este medicamento? No tome esta medicina con ninguno de los siguientes medicamentos: -dofetilida -gatifloxacino -ciertos agentes de contraste administrados antes de un procedimiento con rayos X, tomografas computadas (CT), MRI u otros procedimientos Esta medicina tambin puede interactuar con los siguientes medicamentos: -acetazolamida -ciertos medicamentos para infeccin por VIH o  hepatitis, tales como adefovir, emtricitabina, entecavir, lamivudina o tenofovir -cimetidina -crizotinib -digoxina -diurticos -hormonas femeninas, como estrgenos o progestinas y pldoras  anticonceptivas -glucopirrolato -isoniazida -lamotrigina -medicamentos para presin sangunea, enfermedad cardiaca, pulso cardiaco irregular -memantina -midodrina -metazolamida -morfina -cido nicotnico -fenotiazinas, tales como clorpromacina, mesoridazina, proclorperazina, tioridazina -fenitona -procainamida -propantelina -quinidina -quinina -ranitidina -ranolazina -medicamentos esteroideos, como prednisona o cortisona -medicamentos estimulantes para trastornos de Freight forwarder, perder peso o mantenerse despierto -medicamentos tiroideos -topiramato -trimetoprima -trospio -vancomicina -vandetanib -zonisamida Puede ser que esta lista no menciona todas las posibles interacciones. Informe a su profesional de [employer]	[place] de [employer] productos a base de hierbas, medicamentos de [employer] o suplementos nutritivos que est tomando. Si usted fuma, consume bebidas alcohlicas o si utiliza drogas ilegales, indqueselo tambin a su profesional de [employer]	[place]. Algunas sustancias pueden interactuar con su medicamento. A qu debo estar atento al usar Coca[employer]a? Visite a su mdico o a su profesional de la salud para chequear su evolucin peridicamente. Un examen llamado HbA1C (A1C) ser monitoreado. Es un simple examen de [place]. Mide su control de azcar en la sangre durante los ltimos 2 a 3 meses. Usted recibir [employer] cada 3 a 6 meses. Aprenda cmo controlar el nivel de azcar en la sangre. Aprenda a reconocer los sntomas de bajo y alto nivel de azcar en la sangre y cmo tratarlos. Siempre lleve consigo una fuente rpida de azcar por si acaso experimenta sntomas de bajo nivel de azcar en la sangre. Ejemplos incluyen caramelos duros o tabletas de glucosa. Asegrese de que los miembros de su familia sepan que se puede ahogar si come o bebe mientras tiene sntomas graves de bajo nivel de azcar en la sangre, tales como convulsiones o prdida del conocimiento. Deben obtener ayuda mdica  inmediatamente. Informe a su mdico o a su profesional de la salud si tiene alto nivel de Dispensing optician. Tal vez sea necesario cambiar la dosis de su medicamento. Si est enfermo o haciendo mucho ms ejercicio que el habitual, puede ser necesario cambiar la dosis de su medicamento. No se salte comidas. Pregunte a su mdico o a su profesional de la salud si debe evitar el consumo de alcohol. Muchos productos de venta libre para tos y resfros contienen azcar y alcohol. Estos pueden Magazine features editor de azcar en la sangre. Este medicamento puede provocar la ovulacin en mujeres premenopusicas que no tienen periodos menstruales regulares. Esto puede aumentar la posibilidad de [place]. No debe tomar este medicamento si se queda embarazada o si cree que est embarazada. Consulte a su mdico o su profesional de la salud sobre sus opciones anticonceptivas mientras est tomando Coca[employer]Cola. Si cree que est embarazada, consulte a su mdico o su profesional de la salud inmediatamente. Si va a someterse a una operacin, IRM (MRI), tomografa computarizada u otro procedimiento, informe a su mdico que est tomando Coca-Cola. Usted podr necesitar dejar de tomar este medicamento antes del procedimiento. Use una pulsera o cadena de identificacin mdica. Lleve consigo una tarjeta de identificacin con informacin sobre su enfermedad y Scientist, research (medical) de sus medicamentos y los horarios de las dosis. Qu efectos secundarios puedo tener al [employer] este medicamento? Efectos secundarios que debe informar a su mdico o a Barrister's clerk de la salud tan pronto como sea posible: -reacciones alrgicas como erupcin cutnea, picazn o urticarias, hinchazn de la cara, labios o lengua -problemas respiratorios -sensacin de desmayos o aturdimiento, cadas -dolores o molestias musculares -signos o sntomas de bajo nivel de azcar en  la sangre tales como sentirse ansioso, confusin, mareos, aumento de  apetito, debilidad o cansancio inusual, sudoracin, temblores, fro, irritabilidad, dolor de cabeza, visin borrosa, pulso cardaco rpido, prdida del conocimiento -pulso cardiaco irregular o lento -molestias o dolor de estmago inusual -cansancio o debilidad inusual Efectos secundarios que, por lo general, no requieren atencin mdica (debe informarlos a su mdico o a su profesional de la salud si persisten o si son molestos): -diarrea -dolor de cabeza Victorio Palm de estmago -sabor metlico en la boca -nuseas -molestias estomacales, gases Puede ser que esta lista no menciona todos los posibles efectos secundarios. Comunquese a su mdico por asesoramiento mdico [employer]. Usted puede informar los efectos secundarios a la FDA por telfono al 1-800-FDA-1088. Dnde debo guardar mi medicina? Mantngala fuera del alcance de los nios. Gurdela a [employer], entre 15 y 61 grados C (15 y 63 grados F). Protjala de la humedad y de Naval architect. Deseche todo el medicamento que no haya utilizado, despus de la fecha de vencimiento. ATENCIN: Este folleto es un resumen. Puede ser que no cubra toda la posible informacin. Si usted tiene preguntas acerca de esta medicina, consulte con su mdico, su farmacutico o su profesional de Technical sales engineer.  2018 Elsevier/Gold Standard (2016-10-24 00:00:00)

## 2018-03-26 NOTE — Progress Notes (Signed)
met  CC: New DM to establish care  HPI:  Carly [name] is a 52 y.o. female with a past medical history listed below here today after recently being diagnosed with diabetes to establish care.  Patient is Spanish-speaking only and history was obtained with the aid of an interpreter.  She is followed by the [facility] program who recently screen her for diabetes and hyperlipidemia.  Her A1c on 03/20/2018 returned at 7.2.  Her lipid panel was notable for a cholesterol of 172, triglycerides of 157, HDL 37, LDL 104.  She reports to me today that she did have gestational diabetes during her pregnancies but had resolution of her diabetes following delivery.  She reports she has no complaints today and denies any chest pain, shortness of breath, polyuria, polydipsia.  She denies any known past medical history aside from her gestational diabetes.  No past medical history on file.   Past Surgical History:  Procedure Laterality Date  . CESAREAN SECTION     Family History  Problem Relation Age of Onset  . Cancer Mother        colon  . Hypertension Sister   . Diabetes Brother   . Stroke Brother    Social History   Socioeconomic History  . Marital status: Married    Spouse name: Not on file  . Number of children: Not on file  . Years of education: Not on file  . Highest education level: Not on file  Occupational History  . Not on file  Social Needs  . Financial resource strain: Not on file  . Food insecurity:    Worry: Not on file    Inability: Not on file  . Transportation needs:    Medical: Not on file    Non-medical: Not on file  Tobacco Use  . Smoking status: Never Smoker  . Smokeless tobacco: Never Used  Substance and Sexual Activity  . Alcohol use: Never    Frequency: Never  . Drug use: Never  . Sexual activity: Yes    Birth control/protection: None  Lifestyle  . Physical activity:    Days per week: 5 days    Minutes per session: 60 min  . Stress: Not at all    Relationships  . Social connections:    Talks on phone: More than three times a week    Gets together: Once a week    Attends religious service: More than 4 times per year    Active member of club or organization: Yes    Attends meetings of clubs or organizations: 1 to 4 times per year    Relationship status: Married  Other Topics Concern  . Not on file  Social History Narrative  . Not on file   Review of Systems:   Negative except as noted in HPI  Physical Exam:  Vitals:   03/26/18 1548  BP: 133/67  Pulse: (!) 59  Temp: 98.6 F (37 C)  TempSrc: Oral  SpO2: 99%  Weight: 152 lb 4.8 oz (69.1 kg)  Height: 5' (1.524 m)   General: alert, well-developed, and cooperative to examination.  Head: normocephalic and atraumatic.  Eyes: vision grossly intact, pupils equal, pupils round, pupils reactive to light, no injection and anicteric.  Mouth: pharynx pink and moist, no erythema, and no exudates.  Neck: supple, full ROM, no thyromegaly, no JVD, and no carotid bruits.  Lungs: normal respiratory effort, no accessory muscle use, normal breath sounds, no crackles, and no wheezes. Heart: normal rate, regular rhythm,  no murmur, no gallop, and no rub.  Abdomen: soft, non-tender, normal bowel sounds, no distention, no guarding, no rebound tenderness, no hepatomegaly, and no splenomegaly.  Msk: no joint swelling, no joint warmth, and no redness over joints.  Pulses: 2+ DP/PT pulses bilaterally Extremities: No cyanosis, clubbing, edema Neurologic: No focal deficits Skin: turgor normal and no rashes.  Psych:  Normal mood and affect  Assessment & Plan:   See Encounters Tab for problem based charting.  Patient discussed with Dr. Beryle Beams

## 2018-03-30 DIAGNOSIS — E663 Overweight: Secondary | ICD-10-CM | POA: Insufficient documentation

## 2018-03-30 DIAGNOSIS — E1169 Type 2 diabetes mellitus with other specified complication: Secondary | ICD-10-CM | POA: Insufficient documentation

## 2018-03-30 DIAGNOSIS — E119 Type 2 diabetes mellitus without complications: Secondary | ICD-10-CM | POA: Insufficient documentation

## 2018-03-30 DIAGNOSIS — E785 Hyperlipidemia, unspecified: Secondary | ICD-10-CM

## 2018-03-30 NOTE — Progress Notes (Signed)
Medicine attending: Medical history, presenting problems, physical findings, and medications, reviewed with resident physician Dr Nathan Boswell on the day of the patient visit and I concur with his evaluation and management plan. 

## 2018-03-30 NOTE — Assessment & Plan Note (Signed)
She was recently screened for hypercholesterolemia. Her lipid panel was notable for a cholesterol of 172, triglycerides of 157, HDL 37, LDL 104.   Assessment and plan: Her ASCVD risk is only 3.2%.  However, given her diabetes she qualifies for moderate intensity statin.  We will start her on pravastatin 40 mg daily.  Can recheck a cholesterol panel in 3 to 6 months to assess response.

## 2018-03-30 NOTE — Assessment & Plan Note (Addendum)
Lab Results  Component Value Date   HGBA1C 7.2 (H) 03/20/2018   HGBA1C 5.6 04/03/2012    Patient was recently screened by [facility] for diabetes.  Her A1c returned at 7.2.  She does have a history of gestational diabetes.  She denies any polyuria or polydipsia.  She does report significant carbohydrate intake in her diet in the form of rice and tortillas.   Assessment and plan:  We will start metformin 500 mg twice daily.  We discussed ramping 2000 mg twice daily but patient was confused so we will go to 500 mg daily continue increasing at follow-up visit.  She currently has no insurance or orange card.  She reports she will get the orange card the end of the month.    On follow-up visit we will need to check basic labs including a BMP and urine microalbuminuria.  Additionally, she will need the Pneumovax.  We will also need to do a diabetic foot exam and set her up for eye exam.

## 2018-03-30 NOTE — Assessment & Plan Note (Signed)
Patient's BMI is 29 today.  We discussed dietary changes and exercise.  Will refer to diabetic education and nutrition.

## 2018-05-05 ENCOUNTER — Encounter: Payer: No Typology Code available for payment source | Admitting: Dietician

## 2018-05-05 ENCOUNTER — Ambulatory Visit: Payer: No Typology Code available for payment source

## 2018-05-05 ENCOUNTER — Encounter: Payer: Self-pay | Admitting: Internal Medicine

## 2018-05-13 NOTE — Addendum Note (Signed)
Addended by: Neomia DearPOWERS, Orlander Norwood E on: 05/13/2018 03:30 PM   Modules accepted: Orders

## 2018-07-27 ENCOUNTER — Ambulatory Visit
Admission: RE | Admit: 2018-07-27 | Discharge: 2018-07-27 | Disposition: A | Discharge status: Home / Self Care | Payer: No Typology Code available for payment source | Source: Ambulatory Visit | Attending: Obstetrics and Gynecology | Admitting: Obstetrics and Gynecology

## 2018-07-27 ENCOUNTER — Other Ambulatory Visit: Payer: Self-pay | Admitting: Obstetrics and Gynecology

## 2018-07-27 DIAGNOSIS — Z1231 Encounter for screening mammogram for malignant neoplasm of breast: Secondary | ICD-10-CM

## 2018-07-27 DIAGNOSIS — N6320 Unspecified lump in the left breast, unspecified quadrant: Secondary | ICD-10-CM

## 2018-09-14 ENCOUNTER — Other Ambulatory Visit (HOSPITAL_COMMUNITY)
Admission: RE | Admit: 2018-09-14 | Discharge: 2018-09-14 | Disposition: A | Discharge status: Home / Self Care | Payer: No Typology Code available for payment source | Source: Ambulatory Visit | Attending: Internal Medicine | Admitting: Internal Medicine

## 2018-09-14 ENCOUNTER — Other Ambulatory Visit: Payer: Self-pay

## 2018-09-14 ENCOUNTER — Ambulatory Visit: Payer: Self-pay | Admitting: Internal Medicine

## 2018-09-14 ENCOUNTER — Encounter: Payer: Self-pay | Admitting: Internal Medicine

## 2018-09-14 VITALS — BP 140/75 | HR 60 | Temp 97.7°F | Wt 149.2 lb

## 2018-09-14 DIAGNOSIS — N898 Other specified noninflammatory disorders of vagina: Secondary | ICD-10-CM

## 2018-09-14 DIAGNOSIS — Z7984 Long term (current) use of oral hypoglycemic drugs: Secondary | ICD-10-CM

## 2018-09-14 DIAGNOSIS — E118 Type 2 diabetes mellitus with unspecified complications: Secondary | ICD-10-CM

## 2018-09-14 DIAGNOSIS — E119 Type 2 diabetes mellitus without complications: Secondary | ICD-10-CM

## 2018-09-14 DIAGNOSIS — B373 Candidiasis of vulva and vagina: Secondary | ICD-10-CM

## 2018-09-14 LAB — POCT URINALYSIS DIPSTICK
Bilirubin, UA: NEGATIVE
GLUCOSE UA: POSITIVE — AB
Ketones, UA: NEGATIVE
NITRITE UA: NEGATIVE
Protein, UA: NEGATIVE
Spec Grav, UA: >=1.03 — AB (ref 1.010–1.025)
Urobilinogen, UA: 0.2 E.U./dL
pH, UA: 5 (ref 5.0–8.0)

## 2018-09-14 LAB — POCT GLYCOSYLATED HEMOGLOBIN (HGB A1C): Hemoglobin A1C: 8.7 % — AB (ref 4.0–5.6)

## 2018-09-14 LAB — GLUCOSE, CAPILLARY: Glucose-Capillary: 291 mg/dL — ABNORMAL HIGH (ref 70–99)

## 2018-09-14 MED ORDER — FLUCONAZOLE 150 MG PO TABS: 150.0000 mg | ORAL_TABLET | Freq: Every day | ORAL | 0 refills | Status: DC

## 2018-09-14 NOTE — Patient Instructions (Signed)
[name],  Fue un placer en conocerte.  Por favor haga una cita con tu doctora

## 2018-09-14 NOTE — Progress Notes (Signed)
   CC: vaginal itching  HPI:  [name] is a 52 y.o.  Female with history of type 2 diabetes that presents to the acute care clinic for 2 week history of vaginal itching. She has tried topical over-the-counter anti-vaginal cream with no benefit.  She reports dysuria. She states this has happened before and she was given a prescription for a pill that helped with symptoms. She denies fever/chills, increased urinary frequency or back pain.  No past medical history on file.  Review of Systems:  As noted per history of present illness  Physical Exam:  Vitals:   09/14/18 1113  BP: 140/75  Pulse: 60  Temp: 97.7 F (36.5 C)  TempSrc: Oral  SpO2: 100%  Weight: 149 lb 3.2 oz (67.7 kg)   Physical Exam  Constitutional: She is well-developed, well-nourished, and in no distress.  Cardiovascular: Normal rate, regular rhythm and normal heart sounds. Exam reveals no gallop and no friction rub.  No murmur heard. Pulmonary/Chest: Effort normal and breath sounds normal. No respiratory distress. She has no wheezes. She has no rales.  Genitourinary: Vagina normal and cervix normal. No vaginal discharge found.     Assessment & Plan:   See encounters tab for problem based medical decision making.    Patient discussed with Dr. Rogelia BogaButcher

## 2018-09-15 LAB — CERVICOVAGINAL ANCILLARY ONLY
Bacterial vaginitis: NEGATIVE
Candida vaginitis: POSITIVE — AB
Chlamydia: NEGATIVE
NEISSERIA GONORRHEA: NEGATIVE
Trichomonas: NEGATIVE

## 2018-09-16 DIAGNOSIS — N898 Other specified noninflammatory disorders of vagina: Secondary | ICD-10-CM | POA: Insufficient documentation

## 2018-09-16 MED ORDER — FLUCONAZOLE 150 MG PO TABS: 150.0000 mg | ORAL_TABLET | Freq: Every day | ORAL | 0 refills | Status: DC

## 2018-09-16 NOTE — Assessment & Plan Note (Signed)
Assessment: Uncontrolled Type 2 diabetes Patient states she is not taking metformin as prescribed. She states she has the medication but does not want to take it. She denies it is because of medication side effects or financial issues.  She is agreeable to hemoglobin A1c today.  Plan - hemoglobin A1C  Addendum: hemoglobin A1c was 8.7.  Results were discussed with patient over the phone.  I discussed the importance of starting metformin and following up with PCP for hemoglobin A1c recheck in 3 months.  Patient will start metformin at 500 mg twice a day.

## 2018-09-16 NOTE — Progress Notes (Signed)
Internal Medicine Clinic Attending  Case discussed with Dr. Hoffman at the time of the visit.  We reviewed the resident's history and exam and pertinent patient test results.  I agree with the assessment, diagnosis, and plan of care documented in the resident's note.  

## 2018-09-16 NOTE — Assessment & Plan Note (Signed)
Assessment: Vaginal itching Urine dipstick showed negative nitrites and trace leukocytes. Pelvic exam in office was done checking for trichomonas, GC chlamydia, BV, and candidiasis. Based on symptoms this likely a yeast infection. Will empirically treat with fluconazole.   Plan -Pelvic exam -Fluconazole  Addendum:  Candidiasis positive on exam.  Due to uncontrolled diabetes will give one more dose of fluconazole.  This was discussed with patient 12/11 over the phone and medication sent into pharmacy

## 2018-11-02 ENCOUNTER — Other Ambulatory Visit (HOSPITAL_COMMUNITY): Payer: Self-pay | Admitting: *Deleted

## 2018-11-02 DIAGNOSIS — N6320 Unspecified lump in the left breast, unspecified quadrant: Secondary | ICD-10-CM

## 2018-11-20 LAB — GLUCOSE, POCT (MANUAL RESULT ENTRY): POC Glucose: 189 mg/dl — AB (ref 70–99)

## 2019-01-27 ENCOUNTER — Other Ambulatory Visit: Payer: Self-pay

## 2019-02-03 ENCOUNTER — Telehealth (HOSPITAL_COMMUNITY): Payer: Self-pay | Admitting: *Deleted

## 2019-02-03 NOTE — Telephone Encounter (Signed)
Telephoned patient at home number and confirmed [facility] appointment April 30. No symptoms of COVID-19. No contact with someone with a diagnosis of COVID-19. No travel outside of [place] in the past 14 days. Used interpreter Delorise Royals.

## 2019-02-04 ENCOUNTER — Encounter (HOSPITAL_COMMUNITY): Payer: Self-pay

## 2019-02-04 ENCOUNTER — Ambulatory Visit
Admission: RE | Admit: 2019-02-04 | Discharge: 2019-02-04 | Disposition: A | Discharge status: Home / Self Care | Payer: No Typology Code available for payment source | Source: Ambulatory Visit | Attending: Obstetrics and Gynecology | Admitting: Obstetrics and Gynecology

## 2019-02-04 ENCOUNTER — Ambulatory Visit
Admission: RE | Admit: 2019-02-04 | Discharge: 2019-02-04 | Disposition: A | Discharge status: Home / Self Care | Payer: Self-pay | Source: Ambulatory Visit | Attending: Obstetrics and Gynecology | Admitting: Obstetrics and Gynecology

## 2019-02-04 ENCOUNTER — Other Ambulatory Visit: Payer: Self-pay

## 2019-02-04 ENCOUNTER — Ambulatory Visit (HOSPITAL_COMMUNITY)
Admission: RE | Admit: 2019-02-04 | Discharge: 2019-02-04 | Disposition: A | Discharge status: Home / Self Care | Payer: Self-pay | Source: Ambulatory Visit | Attending: Obstetrics and Gynecology | Admitting: Obstetrics and Gynecology

## 2019-02-04 VITALS — BP 112/78 | Temp 98.0°F | Wt 150.0 lb

## 2019-02-04 DIAGNOSIS — Z1239 Encounter for other screening for malignant neoplasm of breast: Secondary | ICD-10-CM

## 2019-02-04 DIAGNOSIS — N6320 Unspecified lump in the left breast, unspecified quadrant: Secondary | ICD-10-CM

## 2019-02-04 HISTORY — DX: Type 2 diabetes mellitus without complications: E11.9

## 2019-02-04 HISTORY — DX: Hyperlipidemia, unspecified: E78.5

## 2019-02-04 NOTE — Progress Notes (Signed)
Patient referred to [facility] by the [facility] due to recommending a 35-month bilateral diagnostic mammogram and left breast ultrasound. Last left breast diagnostic mammogram and ultrasound completed 07/27/2018.  Pap Smear: Pap smear not completed today. Last Pap smear was 01/19/2018 at the free cervical cancer screening at the [facility] and normal. Per patient has no history of an abnormal Pap smear. Last two Pap smear results are in Epic.  Physical exam: Breasts Breasts symmetrical. No skin abnormalities bilateral breasts. No nipple retraction bilateral breasts. No nipple discharge bilateral breasts. No lymphadenopathy. No lumps palpated bilateral breasts. No complaints of pain or tenderness on exam. Referred patient to the [facility] for a diagnostic mammogram and left breast ultrasound per recommendation. Appointment scheduled for Thursday, February 04, 2019 at 1100.        Pelvic/Bimanual No Pap smear completed today since last Pap smear was 01/19/2018. Pap smear not indicated per BCCCP guidelines.   Smoking History: Patient has never smoked.  Patient Navigation: Patient education provided. Access to services provided for patient through [facility] program. Spanish interpreter provided.   Colorectal Cancer Screening: Per patient has never had a colonoscopy completed. No complaints today.   Breast and Cervical Cancer Risk Assessment: Patient has no family history of breast cancer, known genetic mutations, or radiation treatment to the chest before age 68. Patient has no history of cervical dysplasia, immunocompromised, or DES exposure in-utero.  Risk Assessment    Risk Scores      02/04/2019   Last edited by: Lynnell Dike, LPN   5-year risk: 1 %   Lifetime risk: 7.3 %         Used Spanish interpreter Natale Lay from [place].

## 2019-02-04 NOTE — Patient Instructions (Signed)
Explained breast self awareness with [name]. Patient did not need a Pap smear today due to last Pap smear was 01/19/2018. Let her know BCCCP will cover Pap smears every 3 years unless has a history of abnormal Pap smears. Referred patient to the [facility] for a diagnostic mammogram and left breast ultrasound per recommendation. Appointment scheduled for Thursday, February 04, 2019 at 1100. Patient aware of appointment and will be there. [name] verbalized understanding.  Syliva Mee, Kathaleen Maser, RN 12:11 PM

## 2019-02-22 ENCOUNTER — Encounter (HOSPITAL_COMMUNITY): Payer: Self-pay | Admitting: *Deleted

## 2019-05-24 ENCOUNTER — Other Ambulatory Visit: Payer: Self-pay | Admitting: Internal Medicine

## 2019-05-24 NOTE — Telephone Encounter (Signed)
REFILL REQUEST  metFORMIN (GLUCOPHAGE) 500 MG tablet   pravastatin (PRAVACHOL) 40 MG tablet    [place] (SE), [place] [phone] (Phone) [phone] (Fax)

## 2019-05-24 NOTE — Telephone Encounter (Signed)
Called pt to schedule an appt ; no answer; left message.

## 2019-05-25 MED ORDER — PRAVASTATIN SODIUM 40 MG PO TABS: 40.0000 mg | ORAL_TABLET | Freq: Every day | ORAL | 0 refills | Status: DC

## 2019-05-25 NOTE — Telephone Encounter (Signed)
Please schedule pt an appt with dr Sherry Ruffing. Thanks

## 2019-05-27 ENCOUNTER — Emergency Department (HOSPITAL_COMMUNITY)
Admission: EM | Admit: 2019-05-27 | Discharge: 2019-05-27 | Disposition: A | Discharge status: Home / Self Care | Payer: No Typology Code available for payment source | Attending: Emergency Medicine | Admitting: Emergency Medicine

## 2019-05-27 DIAGNOSIS — L509 Urticaria, unspecified: Secondary | ICD-10-CM | POA: Insufficient documentation

## 2019-05-27 DIAGNOSIS — Z7984 Long term (current) use of oral hypoglycemic drugs: Secondary | ICD-10-CM | POA: Insufficient documentation

## 2019-05-27 DIAGNOSIS — E119 Type 2 diabetes mellitus without complications: Secondary | ICD-10-CM | POA: Insufficient documentation

## 2019-05-27 DIAGNOSIS — L299 Pruritus, unspecified: Secondary | ICD-10-CM | POA: Insufficient documentation

## 2019-05-27 MED ORDER — LORATADINE 10 MG PO TABS
10.0000 mg | ORAL_TABLET | Freq: Once | ORAL | Status: AC
  Administered 2019-05-27: 10 mg via ORAL
  Filled 2019-05-27: qty 1

## 2019-05-27 MED ORDER — PREDNISONE 20 MG PO TABS
60.0000 mg | ORAL_TABLET | Freq: Once | ORAL | Status: AC
  Administered 2019-05-27: 60 mg via ORAL
  Filled 2019-05-27: qty 3

## 2019-05-27 MED ORDER — FAMOTIDINE 20 MG PO TABS: 20.0000 mg | ORAL_TABLET | Freq: Two times a day (BID) | ORAL | 0 refills | Status: DC

## 2019-05-27 MED ORDER — FAMOTIDINE 20 MG PO TABS
20.0000 mg | ORAL_TABLET | Freq: Once | ORAL | Status: AC
  Administered 2019-05-27: 19:00:00 20 mg via ORAL
  Filled 2019-05-27: qty 1

## 2019-05-27 MED ORDER — LORATADINE 10 MG PO TABS: 10.0000 mg | ORAL_TABLET | Freq: Every day | ORAL | 0 refills | Status: DC

## 2019-05-27 MED ORDER — PREDNISONE 10 MG (21) PO TBPK: ORAL_TABLET | ORAL | 0 refills | Status: DC

## 2019-05-27 NOTE — Discharge Instructions (Signed)
Please take medications as prescribed.  Please follow up with your primary care provider within 5-7 days for re-evaluation of your symptoms. If you do not have a primary care provider, information for a healthcare clinic has been provided for you to make arrangements for follow up care. Please return to the emergency department for any new or worsening symptoms.  -------------   [employer] medicamentos segn lo prescrito.  Haga un seguimiento con su proveedor de atencin primaria dentro de 5-7 das para una reevaluacin de sus sntomas. Si no tiene un proveedor de [employer], se le ha proporcionado informacin para una clnica de atencin mdica para que pueda hacer arreglos para la atencin de seguimiento. Regrese al departamento de emergencias por cualquier sntoma nuevo o que empeore

## 2019-05-27 NOTE — ED Notes (Signed)
Patient Alert and oriented to baseline. Stable and ambulatory to baseline. Patient verbalized understanding of the discharge instructions.  Patient belongings were taken by the patient.   

## 2019-05-27 NOTE — ED Triage Notes (Signed)
Via translator, pt reports raised, red, itchy rash appeared this morning. Pt reports she does not know where rash originated, denies recent change in detergent or soaps, denies bites, or contact with wooded areas. Pt denies ingesting new foods or unknown substances. Denies n/v/d or recent travel.

## 2019-05-27 NOTE — ED Provider Notes (Signed)
[facility] Note   CSN: [id number] Arrival date & time: 05/27/19  1715     History   Chief Complaint Chief Complaint  Patient presents with  . Rash   Spanish translator was used throughout this evaluation.  HPI Carly [name] is a 53 y.o. female.     HPI   Patient is a 53 year old female with a history of diabetes, hyperlipidemia, who presents to the emergency department today for evaluation of rash.  She states this morning she developed a red itchy rash to her bilateral upper extremities chest and abdomen.  She denies any swelling of her face, difficulty swallowing, shortness of breath or difficulty breathing.  She denies any new soaps, detergents, medications, foods.  Denies any new pets or environmental changes such as new jobs.  Past Medical History:  Diagnosis Date  . Diabetes mellitus without complication (HCC)   . Hyperlipidemia     Patient Active Problem List   Diagnosis Date Noted  . Vaginal itching 09/16/2018  . Diabetes (HCC) 03/30/2018  . Overweight (BMI 25.0-29.9) 03/30/2018  . Hyperlipidemia associated with type 2 diabetes mellitus (HCC) 03/30/2018  . Family history of colon cancer in mother 10/11/2015  . Health care maintenance 09/06/2015  . Irregular menstrual cycle 06/15/2013  . Screening for cervical cancer 03/06/2012  . FEMALE INFERTILITY 12/28/2007    Past Surgical History:  Procedure Laterality Date  . CESAREAN SECTION       OB History    Gravida  2   Para      Term      Preterm      AB  1   Living  1     SAB      TAB      Ectopic  1   Multiple      Live Births  1            Home Medications    Prior to Admission medications   Medication Sig Start Date End Date Taking? Authorizing Provider  famotidine (PEPCID) 20 MG tablet Take 1 tablet (20 mg total) by mouth 2 (two) times daily for 5 days. 05/27/19 06/01/19  Elroy Schembri S, PA-C  fluconazole (DIFLUCAN) 150  MG tablet Take 1 tablet (150 mg total) by mouth daily. Patient not taking: Reported on 02/04/2019 09/16/18   Geralyn CorwinHoffman, Jessica Ratliff, DO  loratadine (CLARITIN) 10 MG tablet Take 1 tablet (10 mg total) by mouth daily for 7 days. 05/27/19 06/03/19  Melissaann Dizdarevic S, PA-C  metFORMIN (GLUCOPHAGE) 500 MG tablet Take 1 tablet (500 mg total) by mouth 2 (two) times daily with a meal. 03/26/18   Valentino NoseBoswell, Nathan, MD  pravastatin (PRAVACHOL) 40 MG tablet Take 1 tablet (40 mg total) by mouth daily. 05/25/19   Claudean SeveranceKrienke, Marissa M, MD  predniSONE (STERAPRED UNI-PAK 21 TAB) 10 MG (21) TBPK tablet Take 6 tabs by mouth daily  for 2 days, then 5 tabs for 2 days, then 4 tabs for 2 days, then 3 tabs for 2 days, 2 tabs for 2 days, then 1 tab by mouth daily for 2 days 05/27/19   Kadan Millstein S, PA-C    Family History Family History  Problem Relation Age of Onset  . Cancer Mother        colon and uterine  . Hypertension Sister   . Migraines Sister   . Diabetes Brother   . Stroke Brother   . Breast cancer Neg Hx     Social History  Social History   Tobacco Use  . Smoking status: Never Smoker  . Smokeless tobacco: Never Used  Substance Use Topics  . Alcohol use: Never    Frequency: Never  . Drug use: Never     Allergies   Patient has no known allergies.   Review of Systems Review of Systems  Constitutional: Negative for fever.  HENT: Negative for trouble swallowing and voice change.   Respiratory: Negative for cough and shortness of breath.   Cardiovascular: Negative for chest pain.  Gastrointestinal: Negative for diarrhea, nausea and vomiting.  Skin: Positive for rash.     Physical Exam Updated Vital Signs BP (!) 153/71 (BP Location: Right Arm)   Pulse (!) 57   Temp 97.9 F (36.6 C) (Oral)   Resp 14   LMP 02/19/2014   SpO2 99%   Physical Exam Vitals signs and nursing note reviewed.  Constitutional:      General: She is not in acute distress.    Appearance: She is well-developed.   HENT:     Head: Normocephalic and atraumatic.     Mouth/Throat:     Comments: No angioedema, normal voice.  Tolerating secretions. Eyes:     Conjunctiva/sclera: Conjunctivae normal.  Neck:     Musculoskeletal: Neck supple.  Cardiovascular:     Rate and Rhythm: Normal rate and regular rhythm.  Pulmonary:     Effort: Pulmonary effort is normal. No respiratory distress.     Breath sounds: Normal breath sounds. No wheezing, rhonchi or rales.  Musculoskeletal: Normal range of motion.  Skin:    General: Skin is warm and dry.     Comments: Urticarial rash noted to the bilateral upper extremities, chest abdomen and back  Neurological:     Mental Status: She is alert.      ED Treatments / Results  Labs (all labs ordered are listed, but only abnormal results are displayed) Labs Reviewed - No data to display  EKG None  Radiology No results found.  Procedures Procedures (including critical care time)  Medications Ordered in ED Medications  predniSONE (DELTASONE) tablet 60 mg (60 mg Oral Given 05/27/19 1923)  loratadine (CLARITIN) tablet 10 mg (10 mg Oral Given 05/27/19 1923)  famotidine (PEPCID) tablet 20 mg (20 mg Oral Given 05/27/19 1923)     Initial Impression / Assessment and Plan / ED Course  I have reviewed the triage vital signs and the nursing notes.  Pertinent labs & imaging results that were available during my care of the patient were reviewed by me and considered in my medical decision making (see chart for details).      Final Clinical Impressions(s) / ED Diagnoses   Final diagnoses:  Urticaria   Rash consistent with urticaria. Patient denies any difficulty breathing or swallowing.  Pt has a patent airway without stridor and is handling secretions without difficulty; no angioedema. No blisters, no pustules, no warmth, no draining sinus tracts, no superficial abscesses, no bullous impetigo, no vesicles, no desquamation, no target lesions with dusky purpura or a  central bulla. Not tender to touch. No concern for superimposed infection. No concern for SJS, TEN, TSS, tick borne illness, syphilis or other life-threatening condition. Gave claritin, pepcid and steroids in the ED. On recheck she feels much improved. Will discharge home with short course of steroids, pepcid and benadryl as needed for pruritis.    ED Discharge Orders         Ordered    famotidine (PEPCID) 20 MG tablet  2  times daily     05/27/19 2036    predniSONE (STERAPRED UNI-PAK 21 TAB) 10 MG (21) TBPK tablet     05/27/19 2036    loratadine (CLARITIN) 10 MG tablet  Daily     05/27/19 2036           Bishop Dublin 05/27/19 2037    Blanchie Dessert, MD 05/29/19 2041

## 2019-06-23 ENCOUNTER — Inpatient Hospital Stay: Payer: Self-pay | Attending: Obstetrics and Gynecology | Admitting: *Deleted

## 2019-06-23 ENCOUNTER — Ambulatory Visit: Payer: No Typology Code available for payment source

## 2019-06-23 ENCOUNTER — Other Ambulatory Visit: Payer: Self-pay

## 2019-06-23 VITALS — BP 132/84 | Temp 98.1°F | Ht 60.5 in | Wt 142.0 lb

## 2019-06-23 DIAGNOSIS — Z0000 Encounter for general adult medical examination without abnormal findings: Secondary | ICD-10-CM

## 2019-06-23 NOTE — Progress Notes (Signed)
[facility] initial screening   interpreter- Rudene Anda, [employer]   Clinical Measurement:  Height: 60.5 in Weight: 142 lb  Blood Pressure: 116/74  Blood Pressure #2: 132/84 Fasting Labs Drawn Today, will review with patient when they result.   Medical History:  Patient states that she has a history of high cholesterol and diabetes. Patient does not have a history of high blood pressure.  Medications:  Patient states that she takes medication to lower cholesterol. Patient does not take medication to lower blood pressure or blood sugar. Patient does not take an aspirin a day to help prevent a heart attack or stroke. During the past 7 days patient has taken prescribed medication to lower cholesterol on 7 days.   Blood pressure, self measurement: Patient states that she does not measure blood pressure from home.   Nutrition: Patient states that on average she eats 1 cups of fruit and 0 cups of vegetables per day. Patient states that she does eat fish at least 2 times per week. Patient eats less than half servings of whole grains. Patient drinks less than 36 ounces of beverages with added sugar weekly. Patient is not currently watching sodium or salt intake. In the past 7 days patient has not had any drinks containing alcohol. On average patient does not drink any drinks containing alcohol.      Physical activity:  Patient states that she gets 0 minutes of moderate and 0 minutes of vigorous physical activity each week.  Smoking status:  Patient states that she has never smoked tobacco.   Quality of life:  Over the past 2 weeks patient states that nearly every day she has little interest or pleasure in doing things and more than half days where she has felt down, depressed or hopeless.    Risk reduction and counseling:    Health Coaching: Encouraged patient to try and eat 2 cups of fruit and 3 cups of vegetables. I also encouraged patient to try and add heart healthy fish into diet. Gave suggestion  of salmon and tuna. We talked about whole grains and adding them into the diet. Suggested that the patient try oatmeal, brown rice, whole wheat bread or whole wheat pasta. I also suggested that the patient try and start walking for 20 minutes a day.   Navigation:  I will notify patient of lab results.  Patient is aware of 2 more health coaching sessions and a follow up.  Time: 25 minutes

## 2019-06-24 LAB — LIPID PANEL W/O CHOL/HDL RATIO
Cholesterol, Total: 177 mg/dL (ref 100–199)
HDL: 31 mg/dL — ABNORMAL LOW (ref >39)
LDL Chol Calc (NIH): 80 mg/dL (ref 0–99)
Triglycerides: 413 mg/dL — ABNORMAL HIGH (ref 0–149)
VLDL Cholesterol Cal: 66 mg/dL — ABNORMAL HIGH (ref 5–40)

## 2019-06-24 LAB — GLUCOSE, RANDOM: Glucose: 300 mg/dL — ABNORMAL HIGH (ref 65–99)

## 2019-06-24 LAB — HGB A1C W/O EAG: Hgb A1c MFr Bld: 10.4 % — ABNORMAL HIGH (ref 4.8–5.6)

## 2019-06-28 ENCOUNTER — Telehealth: Payer: Self-pay

## 2019-06-28 NOTE — Telephone Encounter (Signed)
Health coaching 2   interpreter- Rudene Anda, [place] cholesterol , 80 LDL cholesterol , 413 triglycerides , 31 HDL cholesterol ,10.4 hemoglobin A1C , 300 mean plasma glucose   Patient understands and is aware of her lab results.   Goals-  Spoke with patient about lab results and answered any questions that patient had regarding results.  Goals- To raise HDL by eating healthy fats like olive oil, avocados, nuts. Adding whole grains into diet like brown rice, oatmeal, whole grain cereals or whole grain bread. Avoiding transfats ans saturated fats. Glucose and Hemoglobin A1C watching the amounts of sweets and sugars that she consumes as well as carbs. Encouraged patient to start walking for at least 20 minutes a day.    Navigation:  Patient is aware of 1 more health coaching sessions and a follow up. Will call patient with follow-up appointment information for Internal Medicine.  Time- 10 minutes

## 2019-06-29 ENCOUNTER — Telehealth: Payer: Self-pay

## 2019-06-29 NOTE — Telephone Encounter (Signed)
Called patient via interpreter Rudene Anda to give patient Carly [name] follow-up appointment information. Patient is scheduled with Internal Medicine on September 28th @ 2:45 pm

## 2019-07-05 ENCOUNTER — Encounter (INDEPENDENT_AMBULATORY_CARE_PROVIDER_SITE_OTHER): Payer: Self-pay

## 2019-07-05 ENCOUNTER — Encounter: Payer: Self-pay | Admitting: Internal Medicine

## 2019-07-05 ENCOUNTER — Ambulatory Visit (INDEPENDENT_AMBULATORY_CARE_PROVIDER_SITE_OTHER): Payer: Self-pay | Admitting: Internal Medicine

## 2019-07-05 ENCOUNTER — Other Ambulatory Visit: Payer: Self-pay

## 2019-07-05 VITALS — BP 129/77 | HR 66 | Temp 98.9°F | Ht 60.0 in | Wt 143.5 lb

## 2019-07-05 DIAGNOSIS — E785 Hyperlipidemia, unspecified: Secondary | ICD-10-CM

## 2019-07-05 DIAGNOSIS — Z23 Encounter for immunization: Secondary | ICD-10-CM

## 2019-07-05 DIAGNOSIS — E119 Type 2 diabetes mellitus without complications: Secondary | ICD-10-CM

## 2019-07-05 DIAGNOSIS — M542 Cervicalgia: Secondary | ICD-10-CM

## 2019-07-05 DIAGNOSIS — E1169 Type 2 diabetes mellitus with other specified complication: Secondary | ICD-10-CM

## 2019-07-05 DIAGNOSIS — Z7984 Long term (current) use of oral hypoglycemic drugs: Secondary | ICD-10-CM

## 2019-07-05 DIAGNOSIS — Z79899 Other long term (current) drug therapy: Secondary | ICD-10-CM

## 2019-07-05 LAB — POCT GLYCOSYLATED HEMOGLOBIN (HGB A1C): Hemoglobin A1C: 10.6 % — AB (ref 4.0–5.6)

## 2019-07-05 LAB — GLUCOSE, CAPILLARY: Glucose-Capillary: 274 mg/dL — ABNORMAL HIGH (ref 70–99)

## 2019-07-05 MED ORDER — METFORMIN HCL 500 MG PO TABS: 500.0000 mg | ORAL_TABLET | Freq: Every day | ORAL | 2 refills | Status: DC

## 2019-07-05 MED ORDER — CANAGLIFLOZIN 100 MG PO TABS: 100.0000 mg | ORAL_TABLET | Freq: Every day | ORAL | 0 refills | Status: DC

## 2019-07-05 MED ORDER — METFORMIN HCL 500 MG PO TABS: 500.0000 mg | ORAL_TABLET | Freq: Two times a day (BID) | ORAL | 2 refills | Status: DC

## 2019-07-05 MED ORDER — PRAVASTATIN SODIUM 40 MG PO TABS: 40.0000 mg | ORAL_TABLET | Freq: Every day | ORAL | 0 refills | Status: DC

## 2019-07-05 NOTE — Patient Instructions (Addendum)
Ms. Kayron Kalmar,  It was a pleasure to see you today. Thank you for coming in.   Today we discussed your neck pain. In regards to this please start using heating pads and over the counter NSAIDS. I have checked some labs, I will call you if they are abnormal  We also discussed your diabetes. Please continue checking your blood sugars at home. Please start taking metformin twice a day. Please start taking canagliflzon 100 mg daily. I have sent it to your pharmacy, please let us know if this is too expensive and we can address this.   I have ordered some labs for you, I will call you if they are abnormal. Please come in on a different day first thing in the morning to get this checked.   Please return to clinic in 3 months or sooner if needed.   Thank you again for coming in.   Lonia Skinner M.D.   Moss Mc. Mara Cordie Grice,  Fue un placer verte hoy. Gracias por venir.  Hoy hablamos de tu dolor de cuello. Con respecto a esto, comience a usar almohadillas trmicas y [place]. He revisado algunos laboratorios, te llamar si son anormales  Tambin hablamos de su diabetes. Contine controlando su azcar en sangre en casa. Empiece a tomar [employer]. Empiece a tomar canagliflzon 100 mg al da. Lo envi a su farmacia, hganos saber si es demasiado caro y podemos solucionarlo.  He ordenado algunos laboratorios para ti, te llamar si son anormales. Lesia Hausen da a primera hora de la maana para que lo revisen.  Regrese a Copy en 3 meses o antes si es necesario.  Gracias de nuevo por venir.  Asencion Noble.D.

## 2019-07-05 NOTE — Progress Notes (Signed)
   CC: Diabetes, HLD, neck pain  HPI:  Carly [name] is a 53 y.o.  with a PMH listed below presenting for diabetes, HLD, neck pain.    Please see A&P for status of the patient's chronic medical conditions  Past Medical History:  Diagnosis Date  . Diabetes mellitus without complication ([place])   . Hyperlipidemia    Review of Systems: Refer to history of present illness and assessment and plans for pertinent review of systems, all others reviewed and negative.  Physical Exam:  Vitals:   07/05/19 1458  BP: 129/77  Pulse: 66  Temp: 98.9 F (37.2 C)  TempSrc: Oral  SpO2: 100%  Weight: 143 lb 8 oz (65.1 kg)  Height: 5' (1.524 m)   Physical Exam  Constitutional: She is well-developed, well-nourished, and in no distress.  HENT:  Head: Normocephalic and atraumatic.  Cardiovascular: Normal rate, regular rhythm and normal heart sounds.  Pulmonary/Chest: Effort normal and breath sounds normal. No respiratory distress.  Abdominal: Soft. Bowel sounds are normal. She exhibits no distension.  Musculoskeletal: Normal range of motion.        General: No tenderness, deformity or edema.    Social History   Socioeconomic History  . Marital status: Married    Spouse name: Not on file  . Number of children: 2  . Years of education: Not on file  . Highest education level: 6th grade  Occupational History  . Not on file  Social Needs  . Financial resource strain: Not on file  . Food insecurity    Worry: Not on file    Inability: Not on file  . Transportation needs    Medical: No    Non-medical: No  Tobacco Use  . Smoking status: Never Smoker  . Smokeless tobacco: Never Used  Substance and Sexual Activity  . Alcohol use: Never    Frequency: Never  . Drug use: Never  . Sexual activity: Yes    Birth control/protection: None  Lifestyle  . Physical activity    Days per week: 5 days    Minutes per session: 60 min  . Stress: Not at all  Relationships  . Social  connections    Talks on phone: More than three times a week    Gets together: Once a week    Attends religious service: More than 4 times per year    Active member of club or organization: Yes    Attends meetings of clubs or organizations: 1 to 4 times per year    Relationship status: Married  . Intimate partner violence    Fear of current or ex partner: Patient refused    Emotionally abused: No    Physically abused: No    Forced sexual activity: No  Other Topics Concern  . Not on file  Social History Narrative  . Not on file    Family History  Problem Relation Age of Onset  . Cancer Mother        colon and uterine  . Hypertension Sister   . Migraines Sister   . Diabetes Brother   . Stroke Brother   . Breast cancer Neg Hx     Assessment & Plan:   See Encounters Tab for problem based charting.  Patient discussed with Dr. Philipp Ovens

## 2019-07-06 LAB — CBC
Hematocrit: 40.3 % (ref 34.0–46.6)
Hemoglobin: 14.2 g/dL (ref 11.1–15.9)
MCH: 30.4 pg (ref 26.6–33.0)
MCHC: 35.2 g/dL (ref 31.5–35.7)
MCV: 86 fL (ref 79–97)
Platelets: 304 10*3/uL (ref 150–450)
RBC: 4.67 x10E6/uL (ref 3.77–5.28)
RDW: 12.6 % (ref 11.7–15.4)
WBC: 8.9 10*3/uL (ref 3.4–10.8)

## 2019-07-06 LAB — BMP8+ANION GAP
Anion Gap: 19 mmol/L — ABNORMAL HIGH (ref 10.0–18.0)
BUN/Creatinine Ratio: 16 (ref 9–23)
BUN: 14 mg/dL (ref 6–24)
CO2: 19 mmol/L — ABNORMAL LOW (ref 20–29)
Calcium: 9.9 mg/dL (ref 8.7–10.2)
Chloride: 99 mmol/L (ref 96–106)
Creatinine, Ser: 0.89 mg/dL (ref 0.57–1.00)
GFR calc Af Amer: 85 mL/min/{1.73_m2} (ref >59)
GFR calc non Af Amer: 74 mL/min/{1.73_m2} (ref >59)
Glucose: 246 mg/dL — ABNORMAL HIGH (ref 65–99)
Potassium: 4.4 mmol/L (ref 3.5–5.2)
Sodium: 137 mmol/L (ref 134–144)

## 2019-07-06 LAB — URINALYSIS, ROUTINE W REFLEX MICROSCOPIC
Bilirubin, UA: NEGATIVE
Leukocytes,UA: NEGATIVE
Nitrite, UA: NEGATIVE
Protein,UA: NEGATIVE
RBC, UA: NEGATIVE
Specific Gravity, UA: 1.028 (ref 1.005–1.030)
Urobilinogen, Ur: 0.2 mg/dL (ref 0.2–1.0)
pH, UA: 5 (ref 5.0–7.5)

## 2019-07-06 LAB — MICROALBUMIN / CREATININE URINE RATIO
Creatinine, Urine: 205.2 mg/dL
Microalb/Creat Ratio: 5 mg/g creat (ref 0–29)
Microalbumin, Urine: 9.3 ug/mL

## 2019-07-07 DIAGNOSIS — M542 Cervicalgia: Secondary | ICD-10-CM | POA: Insufficient documentation

## 2019-07-07 NOTE — Assessment & Plan Note (Addendum)
She reports that she has some pain in the back of her neck. She thinks she has an infection because of her high blood sugars, since this is what she had before. She also thinks the infection is due to high blood pressures. She has been feeling very weak in her legs but that has improved.  She states that she has had neck pain for the past 6 years, feels that it is related to stress, occurs every few weeks and will last for about 3 days ago, it's more of a discomfort and a pressure sensation, comes and goes. Has not tried anything for it. She denies any weakness of numbness. No radiation of the pain. Denies any pain with pressure. She has a lot of appointments which has been causing her a lot of stress.  On exam she has some no tenderness to palpation of her the posterior aspect of her right neck area, no erythema, edema, rash, or warmth noted.  Patient was wondering about an antibiotic, discussed that it does not seem infected but we will check a CBC to rule this now.  Discussed starting some anti-inflammatories, she declined, stating that it's just related to stress.   -CBC -Advised patient to try heat and over-the-counter NSAIDs if symptoms recur

## 2019-07-07 NOTE — Assessment & Plan Note (Signed)
Patient has history of hyperlipidemia. Labs on 9/16 showed cholesterol 177, HDL 31, triglycerides 113, LDL 80.  Unclear if this is a fasting lab.  She is currently on pravastatin 40 mg daily and she reports she needs a refill on this medication.  We will repeat the labs as fasting, she may need a triglyceride lowering medication if it's still elevated.  -Pravastatin 40 mg daily -Fasting lipid panel

## 2019-07-07 NOTE — Assessment & Plan Note (Addendum)
Patient has a history of diabetes. Her A1c today is 10.6, glucose 246.  She has not been taking any metformin, she has been out of it for about a month now. She was only taking it once a day. She stopped taking it twice a day because she started having side effects, she was having some lightheadedness and dizziness.  We discussed that given her elevated A1c we will need to start her on another medication, discussed starting canagliflozin.  Patient is in agreement with this.  -Continue metformin 500 mg twice daily, advised her that she can take it once if needed -Start canagliflozin 100 mg daily -Check BMP, U/A, and urine protein -Return to clinic in 3 months to repeat A1c

## 2019-07-08 NOTE — Progress Notes (Signed)
Internal Medicine Clinic Attending  Case discussed with Dr. Sherry Ruffing at the time of the visit.  We reviewed the resident's history and exam and pertinent patient test results.  I agree with the assessment, diagnosis, and plan of care documented in the resident's note with the following correction.  Patient prescribed metformin 500 mg XL BID. Since she is on the long acting formulation, ok for her to take 1,000 mg (2 tablets) once daily.

## 2019-08-04 ENCOUNTER — Telehealth: Payer: Self-pay

## 2019-08-04 NOTE — Telephone Encounter (Signed)
Health Coaching 3  interpreter- Rudene Anda, [employer]   Goals- Patient has added healthy fats in diet. Patient states that she has been cooking with olive oil as well as eating avocados and almonds. Patient has started eating a bowl of oatmeal a day. Patient states that she has also started sauteing her food. She has also increased the amount of vegetables she consumes.   New goal- Patient is going to add more whole grains in diet like whole grain cereals and brown rice.  Barrier to reaching goal- none   Strategies to overcome- NA   Navigation:  Patient is aware of  a follow up session. Patient is scheduled for follow-up visit on Wednesday, December 2nd @ 2:00 pm   Time- 11 minutes

## 2019-09-08 ENCOUNTER — Inpatient Hospital Stay: Payer: Self-pay | Attending: Obstetrics and Gynecology | Admitting: *Deleted

## 2019-09-08 ENCOUNTER — Other Ambulatory Visit: Payer: Self-pay

## 2019-09-08 VITALS — BP 124/72 | Temp 97.8°F | Ht 60.5 in | Wt 144.0 lb

## 2019-09-08 DIAGNOSIS — Z0000 Encounter for general adult medical examination without abnormal findings: Secondary | ICD-10-CM

## 2019-09-08 NOTE — Progress Notes (Signed)
[facility] follow up   Clinical Measurement:  Height: 60.5 in Weight: 144 lb  Blood Pressure: 133/67  Blood Pressure #2: 124/72    Medical History:  Patient states that she does have high cholesterol and diabetes. Patient does not have high blood pressure.   Medications:  Patient states that she takes medication to lower cholesterol and blood sugar. Patient does not take any medication to lower blood pressure. Patient does not take an aspirin a day to help prevent a heart attack or stroke. During the past 7 days patient has taken prescribed medication to lower cholesterol and blood sugar on all 7 days.  Blood pressure, self measurement:  Patient states that she does not measure blood pressure from home and has not been told to do so by a healthcare provider.   Nutrition:  Patient states that on average she eats 2 cups of fruit and 3 cups of vegetables per day. Patient states that she does not eat fish at least 2 times per week. In a typical day patient eats less than half servings of whole grains. Patient drinks less than 36 ounces of beverages with added sugar weekly. Patient is currently watching sodium or salt intake. In the past 7 days patient has not had any drinks containing alcohol. On average patient does not drink any drinks containing alcohol.       Physical activity:  Patient states that she gets 60 minutes of moderate and 0 minutes of vigorous physical activity each week.  Smoking status:  Patient states that she has never smoked tobacco.   Quality of life:  Over the past 2 weeks patient states that she has had several days where she has little interest or pleasure in doing things and 0 days where she has felt down, depressed or hopeless.   Health Coaching: Encouraged patient to continue watching diet. Encouraged patient to continue watching the amount of fried and fatty foods she consumes. As well as trying to add in heart healthy fish and whole grains. Also discussed watching the  amount of sugars and sweets that she consumes. Patient stated that she has only been walking 3 days a week now that it is getting colder. Encouraged her to try to do an at home exercise video on the other days.    Navigation: This was the  follow up session for this patient, I will check up on her progress in the coming months.  Time: 25 minutes

## 2019-09-13 NOTE — Addendum Note (Signed)
Addended by: Asencion Noble on: 09/13/2019 08:59 AM   Modules accepted: Orders

## 2019-11-24 ENCOUNTER — Other Ambulatory Visit: Payer: Self-pay

## 2019-11-24 DIAGNOSIS — N6320 Unspecified lump in the left breast, unspecified quadrant: Secondary | ICD-10-CM

## 2019-12-10 ENCOUNTER — Other Ambulatory Visit: Payer: Self-pay | Admitting: General Practice

## 2019-12-10 DIAGNOSIS — E785 Hyperlipidemia, unspecified: Secondary | ICD-10-CM

## 2019-12-10 DIAGNOSIS — E1169 Type 2 diabetes mellitus with other specified complication: Secondary | ICD-10-CM

## 2019-12-10 NOTE — Telephone Encounter (Signed)
Need refill on pravastatin (PRAVACHOL) 40 MG tablet  ;pt contact [phone]   [id number] - [place] ([place]), [place] - [address]

## 2019-12-13 MED ORDER — PRAVASTATIN SODIUM 40 MG PO TABS: 40.0000 mg | ORAL_TABLET | Freq: Every day | ORAL | 0 refills | Status: DC

## 2019-12-14 ENCOUNTER — Other Ambulatory Visit: Payer: Self-pay | Admitting: General Practice

## 2019-12-14 NOTE — Telephone Encounter (Signed)
Pravastatin rx was refilled yesterday. Pt called and explained to call the pharmacy; voiced understanding.

## 2019-12-14 NOTE — Telephone Encounter (Signed)
Needs refill pravastatin (PRAVACHOL) 40 MG tablet  [phone]   [id number] -  ([place]), [place] - [address]

## 2020-02-08 ENCOUNTER — Ambulatory Visit: Payer: No Typology Code available for payment source

## 2020-02-08 ENCOUNTER — Ambulatory Visit
Admission: RE | Admit: 2020-02-08 | Discharge: 2020-02-08 | Disposition: A | Discharge status: Home / Self Care | Payer: No Typology Code available for payment source | Source: Ambulatory Visit | Attending: Obstetrics and Gynecology | Admitting: Obstetrics and Gynecology

## 2020-02-08 ENCOUNTER — Other Ambulatory Visit: Payer: Self-pay

## 2020-02-08 VITALS — BP 134/82 | Temp 97.3°F | Wt 147.0 lb

## 2020-02-08 DIAGNOSIS — Z1239 Encounter for other screening for malignant neoplasm of breast: Secondary | ICD-10-CM

## 2020-02-08 DIAGNOSIS — N6320 Unspecified lump in the left breast, unspecified quadrant: Secondary | ICD-10-CM

## 2020-02-08 DIAGNOSIS — Z789 Other specified health status: Secondary | ICD-10-CM

## 2020-02-08 NOTE — Progress Notes (Addendum)
[name] is a 54 y.o. female who presents to [name] clinic today with complaint of"cramps" in her breast, particularly her left.  Patient states that the pain occurs once q3 weeks, but doesn't last long or require med for resolution.   Breast History: Patient reports that she completes SBE, but not often, and has never felt any lumps.  Patient denies a family history of breast cancer.    Gynecological History: Pap not smear completed today. Last Pap smear was April 2019 at [facility] clinic and was normal. Per patient has no history of an abnormal Pap smear. Last Pap smear result is available in Epic.   Physical exam: Breasts Breasts symmetrical. No skin abnormalities bilateral breasts. No nipple retraction bilateral breasts. No nipple discharge bilateral breasts. No lymphadenopathy. No lumps palpated in right breasts and questionable mass in left mass in outer quadrant near 2'o clock-Non tender.        Pelvic/Bimanual Pap is not indicated today    Smoking History: Patient has never smoked.    Patient Navigation: Patient education provided. Access to services provided for patient through [facility] program. In person interpreter provided. No transportation provided   Colorectal Cancer Screening: Per patient a FIT Test was completed and Negative 2 years ago.  No complaints today.    Breast and Cervical Cancer Risk Assessment: Patient does not have family history of breast cancer, known genetic mutations, or radiation treatment to the chest before age 55. Patient does not have history of cervical dysplasia, immunocompromised, or DES exposure in-utero. However, records show patient mother with history of cervical cancer.   Risk Assessment    Risk Scores      02/08/2020 02/04/2019   Last edited by: Narda Rutherford, LPN Stoney Bang H, LPN   5-year risk: 1 % 1 %   Lifetime risk: 7.2 % 7.3 %          A: BCCCP Exam  Breast Exam No Pap Indicated  Language Barrier  P: -Exam  performed and findings discussed. -Patient questions and concerns addressed.  -Referred patient to the [facility] for a diagnostic mammogram.  -Appointment scheduled Feb 08, 2020. -Interpretations completed with assistance of Erika.   Gerrit Heck, CNM 02/08/2020 10:14 AM

## 2020-03-09 ENCOUNTER — Other Ambulatory Visit: Payer: Self-pay

## 2020-03-09 DIAGNOSIS — E785 Hyperlipidemia, unspecified: Secondary | ICD-10-CM

## 2020-03-09 DIAGNOSIS — E119 Type 2 diabetes mellitus without complications: Secondary | ICD-10-CM

## 2020-03-09 MED ORDER — METFORMIN HCL 500 MG PO TABS: 500.0000 mg | ORAL_TABLET | Freq: Two times a day (BID) | ORAL | 0 refills | Status: DC

## 2020-03-09 MED ORDER — PRAVASTATIN SODIUM 40 MG PO TABS: 40.0000 mg | ORAL_TABLET | Freq: Every day | ORAL | 0 refills | Status: DC

## 2020-03-09 NOTE — Telephone Encounter (Signed)
pravastatin (PRAVACHOL) 40 MG tablet  metFORMIN (GLUCOPHAGE) 500 MG tablet, REFILL REQUEST @  [facility] [address] ([address]), [place] - [address] [phone] (Phone) [phone] (Fax

## 2020-03-10 ENCOUNTER — Ambulatory Visit: Payer: Self-pay | Admitting: Internal Medicine

## 2020-03-10 ENCOUNTER — Other Ambulatory Visit: Payer: Self-pay

## 2020-03-10 ENCOUNTER — Other Ambulatory Visit (HOSPITAL_COMMUNITY): Payer: Self-pay | Admitting: Internal Medicine

## 2020-03-10 DIAGNOSIS — E1169 Type 2 diabetes mellitus with other specified complication: Secondary | ICD-10-CM

## 2020-03-10 DIAGNOSIS — E785 Hyperlipidemia, unspecified: Secondary | ICD-10-CM

## 2020-03-10 DIAGNOSIS — E119 Type 2 diabetes mellitus without complications: Secondary | ICD-10-CM

## 2020-03-10 LAB — POCT GLYCOSYLATED HEMOGLOBIN (HGB A1C): Hemoglobin A1C: 7.9 % — AB (ref 4.0–5.6)

## 2020-03-10 LAB — GLUCOSE, CAPILLARY: Glucose-Capillary: 150 mg/dL — ABNORMAL HIGH (ref 70–99)

## 2020-03-10 MED ORDER — METFORMIN HCL 500 MG PO TABS: 500.0000 mg | ORAL_TABLET | Freq: Two times a day (BID) | ORAL | 2 refills | Status: DC

## 2020-03-10 MED ORDER — PRAVASTATIN SODIUM 40 MG PO TABS: 40.0000 mg | ORAL_TABLET | Freq: Every day | ORAL | 0 refills | Status: DC

## 2020-03-10 MED ORDER — METFORMIN HCL 500 MG PO TABS: 500.0000 mg | ORAL_TABLET | Freq: Two times a day (BID) | ORAL | 0 refills | Status: DC

## 2020-03-10 MED ORDER — PRAVASTATIN SODIUM 40 MG PO TABS: 40.0000 mg | ORAL_TABLET | Freq: Every day | ORAL | 2 refills | Status: DC

## 2020-03-10 MED ORDER — CANAGLIFLOZIN 100 MG PO TABS: 100.0000 mg | ORAL_TABLET | Freq: Every day | ORAL | 0 refills | Status: DC

## 2020-03-10 MED FILL — PRAVASTATIN NA 40 MG TAB: 40 | 30 days supply | Qty: 30 | Fill #0

## 2020-03-10 MED FILL — metFORMIN HCL 500 MG TABS: 500 | 30 days supply | Qty: 60 | Fill #0

## 2020-03-10 MED FILL — INVOKANA 100 MG TABLET: 100 | 30 days supply | Qty: 30 | Fill #0

## 2020-03-10 NOTE — Assessment & Plan Note (Signed)
Patient reports that she is not having any issues at this time, she is just wanting a refill of her pravastatin and Metformin.  She denies any symptoms such as chest pain, shortness of breath, polyuria, polydipsia, changes in appetite, abdominal pain, nausea, vomiting, fevers, chills, or any other symptoms.  She reports that she did not get the canagliflozin due to it being too expensive.  Her last A1c was elevated up to 10.6. Her BMP was unremarkable, urine protein was normal. She has been taking the Metformin 500 mg twice a day with no side effects.  She last saw an eye doctor about 2 years ago.  Given the elevated A1c on her last visit it is likely that her diabetes is still not controlled with the Metformin, discussed that we can have her see the financial counselor to try to get an orange card.  Also discussed that we can try to do the canagliflozin through the [facility] pharmacy and the IM program to see if that will be affordable.  She is in agreement with this plan.  -Check A1c today -Continue Metformin 500 mg twice daily, can increase on next visit -Start canagliflozin, sent to [facility] pharmacy under IM TS program -Financial counselor to assist with orange card

## 2020-03-10 NOTE — Assessment & Plan Note (Signed)
Patient has history of hyperlipidemia and is currently on pravastatin.  We will refill this and consider repeating lipid panel on next visit.

## 2020-03-10 NOTE — Progress Notes (Signed)
Internal Medicine Clinic Attending  Case discussed with Dr. Krienke at the time of the visit.  We reviewed the resident's history and exam and pertinent patient test results.  I agree with the assessment, diagnosis, and plan of care documented in the resident's note.    

## 2020-03-10 NOTE — Progress Notes (Signed)
   CC: Needs refill on medications  HPI:  Carly [name] is a 54 y.o. with a history of diabetes and HLD presenting to have her medications refilled.   Patient reports that she is not having any issues at this time, she is just wanting a refill of her pravastatin and Metformin.  She denies any symptoms such as chest pain, shortness of breath, polyuria, polydipsia, changes in appetite, abdominal pain, nausea, vomiting, fevers, chills, or any other symptoms.  She reports that she did not get the canagliflozin due to it being too expensive.  Her last A1c was elevated up to 10.6. Her BMP was unremarkable, urine protein was normal. She has been taking the Metformin 500 mg twice a day with no side effects.  She last saw an eye doctor about 2 years ago.    Past Medical History:  Diagnosis Date  . Diabetes mellitus without complication ([facility])   . Hyperlipidemia    Review of Systems:   Constitutional: Negative for chills and fever.  Respiratory: Negative for shortness of breath.   Cardiovascular: Negative for chest pain and leg swelling.  Gastrointestinal: Negative for abdominal pain, nausea and vomiting.  Neurological: Negative for dizziness and headaches.   Physical Exam:  Vitals:   03/10/20 1048  BP: 134/67  Pulse: (!) 58  Temp: 98 F (36.7 C)  TempSrc: Oral  SpO2: 100%  Weight: 146 lb 14.4 oz (66.6 kg)  Height: 5' (1.524 m)   Physical Exam  Constitutional: She is oriented to person, place, and time and well-developed, well-nourished, and in no distress.  HENT:  Head: Normocephalic and atraumatic.  Eyes: Pupils are equal, round, and reactive to light. EOM are normal.  Cardiovascular: Normal rate and regular rhythm.  Pulmonary/Chest: Effort normal and breath sounds normal. No respiratory distress.  Abdominal: Soft. Bowel sounds are normal.  Musculoskeletal:        General: No edema. Normal range of motion.     Cervical back: Normal range of motion and neck supple.    Neurological: She is alert and oriented to person, place, and time.  Skin: Skin is warm and dry.  Psychiatric: Mood and affect normal.    Assessment & Plan:   See Encounters Tab for problem based charting.  Patient discussed with Dr. Rogelia Boga

## 2020-03-10 NOTE — Patient Instructions (Addendum)
Ms. Amil Moseman,  It was a pleasure to see you today. Thank you for coming in.   Today we discussed your diabetes. In regards to this please continue taking the metformin. Start taking the Canagliflozin, I sent this to a pharmacy that should be affordable. We are checking some labs today to see how your diabetes is doing. I have referred you the our social workers to help with the financial aspect.    We also discussed your cholesterol. Please continue taking the pravastatin.   Please set up an appointment with our financial counselors.   Please return to clinic in 2 weeks or sooner if needed.   Thank you again for coming in.   Hermine Messick M.D.  Viona Gilmore. Mara Nelly Rout,  Fue un placer verte hoy. Gracias por venir.  Hoy hablamos de tu diabetes. Con respecto a esto, contine tomando metformina. Comience a tomar Canagliflozin, lo envi a una farmacia que debera ser asequible. Hoy estamos revisando algunos laboratorios para ver cmo le est yendo a su diabetes. Le he recomendado a nuestros trabajadores sociales para ayudar con el aspecto financiero.  Tambin hablamos de su colesterol. Contine tomando pravastatina.  Concierte una cita con nuestros asesores financieros.  Regrese a Glass blower/designer en 2 semanas o antes si es necesario.  Gracias de nuevo por venir.  Claudean Severance.D.

## 2020-04-05 ENCOUNTER — Ambulatory Visit: Payer: No Typology Code available for payment source

## 2020-04-10 MED FILL — INVOKANA 100 MG TABLET: 100 | 30 days supply | Qty: 30 | Fill #1

## 2020-04-10 MED FILL — PRAVASTATIN NA 40 MG TAB: 40 | 30 days supply | Qty: 30 | Fill #1

## 2020-05-12 MED FILL — PRAVASTATIN NA 40 MG TAB: 40 | 30 days supply | Qty: 30 | Fill #2

## 2020-05-12 MED FILL — INVOKANA 100 MG TABLET: 100 | 30 days supply | Qty: 30 | Fill #2

## 2020-06-13 ENCOUNTER — Other Ambulatory Visit: Payer: Self-pay | Admitting: Internal Medicine

## 2020-06-13 DIAGNOSIS — E119 Type 2 diabetes mellitus without complications: Secondary | ICD-10-CM

## 2020-06-13 DIAGNOSIS — E785 Hyperlipidemia, unspecified: Secondary | ICD-10-CM

## 2020-06-13 MED FILL — METFORMIN HCL 500 MG TABS: 500 | 30 days supply | Qty: 60 | Fill #1

## 2020-06-14 ENCOUNTER — Other Ambulatory Visit: Payer: Self-pay | Admitting: Student

## 2020-06-14 DIAGNOSIS — E119 Type 2 diabetes mellitus without complications: Secondary | ICD-10-CM

## 2020-06-14 DIAGNOSIS — E785 Hyperlipidemia, unspecified: Secondary | ICD-10-CM

## 2020-06-14 MED ORDER — PRAVASTATIN SODIUM 40 MG PO TABS: 40.0000 mg | ORAL_TABLET | Freq: Every day | ORAL | 0 refills | Status: DC

## 2020-06-14 MED ORDER — CANAGLIFLOZIN 100 MG PO TABS: ORAL_TABLET | ORAL | 0 refills | Status: DC

## 2020-06-14 MED FILL — PRAVASTATIN NA 40 MG TAB: 40 | 30 days supply | Qty: 30 | Fill #0

## 2020-06-14 MED FILL — INVOKANA 100 MG TABLET: 100 | 30 days supply | Qty: 30 | Fill #0

## 2020-06-23 MED FILL — INVOKANA 100 MG TABLET: 100 | 30 days supply | Qty: 30 | Fill #0

## 2020-06-23 MED FILL — PRAVASTATIN NA 40 MG TAB: 40 | 30 days supply | Qty: 30 | Fill #0

## 2020-07-24 ENCOUNTER — Other Ambulatory Visit: Payer: Self-pay | Admitting: Student

## 2020-07-24 DIAGNOSIS — E785 Hyperlipidemia, unspecified: Secondary | ICD-10-CM

## 2020-07-24 DIAGNOSIS — E1169 Type 2 diabetes mellitus with other specified complication: Secondary | ICD-10-CM

## 2020-07-24 DIAGNOSIS — E119 Type 2 diabetes mellitus without complications: Secondary | ICD-10-CM

## 2020-07-24 MED FILL — METFORMIN HCL 500 MG TABS: 500 | 30 days supply | Qty: 60 | Fill #2

## 2020-07-25 ENCOUNTER — Other Ambulatory Visit (HOSPITAL_COMMUNITY): Payer: Self-pay | Admitting: Student

## 2020-07-25 MED FILL — INVOKANA 100 MG TABLET: 100 | 30 days supply | Qty: 30 | Fill #0

## 2020-07-25 MED FILL — PRAVASTATIN NA 40 MG TAB: 40 | 30 days supply | Qty: 30 | Fill #0

## 2020-07-31 ENCOUNTER — Other Ambulatory Visit: Payer: Self-pay

## 2020-07-31 ENCOUNTER — Other Ambulatory Visit (HOSPITAL_COMMUNITY): Payer: Self-pay | Admitting: Internal Medicine

## 2020-07-31 ENCOUNTER — Ambulatory Visit: Payer: Self-pay | Admitting: Internal Medicine

## 2020-07-31 ENCOUNTER — Encounter: Payer: Self-pay | Admitting: Internal Medicine

## 2020-07-31 VITALS — Temp 98.2°F | Ht 60.0 in | Wt 145.0 lb

## 2020-07-31 DIAGNOSIS — E119 Type 2 diabetes mellitus without complications: Secondary | ICD-10-CM

## 2020-07-31 DIAGNOSIS — Z7984 Long term (current) use of oral hypoglycemic drugs: Secondary | ICD-10-CM

## 2020-07-31 DIAGNOSIS — E785 Hyperlipidemia, unspecified: Secondary | ICD-10-CM

## 2020-07-31 DIAGNOSIS — E1169 Type 2 diabetes mellitus with other specified complication: Secondary | ICD-10-CM

## 2020-07-31 LAB — POCT GLYCOSYLATED HEMOGLOBIN (HGB A1C): Hemoglobin A1C: 7.1 % — AB (ref 4.0–5.6)

## 2020-07-31 LAB — GLUCOSE, CAPILLARY: Glucose-Capillary: 202 mg/dL — ABNORMAL HIGH (ref 70–99)

## 2020-07-31 MED ORDER — PRAVASTATIN SODIUM 40 MG PO TABS: 40.0000 mg | ORAL_TABLET | Freq: Every day | ORAL | 0 refills | Status: DC

## 2020-07-31 MED ORDER — METFORMIN HCL 500 MG PO TABS: 500.0000 mg | ORAL_TABLET | Freq: Two times a day (BID) | ORAL | 0 refills | Status: DC

## 2020-07-31 MED ORDER — CANAGLIFLOZIN 100 MG PO TABS: ORAL_TABLET | ORAL | 0 refills | Status: DC

## 2020-07-31 NOTE — Progress Notes (Signed)
CC: DM  HPI:  Carly [name] is a 54 y.o. female with a past medical history stated below and presents today for DM. Please see problem based assessment and plan for additional details.  Past Medical History:  Diagnosis Date  . Diabetes mellitus without complication ([facility])   . Hyperlipidemia     Current Outpatient Medications on File Prior to Visit  Medication Sig Dispense Refill  . famotidine (PEPCID) 20 MG tablet Take 1 tablet (20 mg total) by mouth 2 (two) times daily for 5 days. 10 tablet 0  . loratadine (CLARITIN) 10 MG tablet Take 1 tablet (10 mg total) by mouth daily for 7 days. 7 tablet 0   No current facility-administered medications on file prior to visit.    Family History  Problem Relation Age of Onset  . Cancer Mother        colon and uterine  . Hypertension Sister   . Migraines Sister   . Diabetes Brother   . Stroke Brother   . Breast cancer Neg Hx     Social History   Socioeconomic History  . Marital status: Married    Spouse name: Not on file  . Number of children: 1  . Years of education: Not on file  . Highest education level: 6th grade  Occupational History  . Not on file  Tobacco Use  . Smoking status: Never Smoker  . Smokeless tobacco: Never Used  Vaping Use  . Vaping Use: Never used  Substance and Sexual Activity  . Alcohol use: Never  . Drug use: Never  . Sexual activity: Yes    Birth control/protection: None, Post-menopausal  Other Topics Concern  . Not on file  Social History Narrative  . Not on file   Social Determinants of Health   Financial Resource Strain:   . Difficulty of Paying Living Expenses: Not on file  Food Insecurity:   . Worried About Programme researcher, broadcasting/film/video in the Last Year: Not on file  . Ran Out of Food in the Last Year: Not on file  Transportation Needs: No Transportation Needs  . Lack of Transportation (Medical): No  . Lack of Transportation (Non-Medical): No  Physical Activity:   . Days of  Exercise per Week: Not on file  . Minutes of Exercise per Session: Not on file  Stress:   . Feeling of Stress : Not on file  Social Connections:   . Frequency of Communication with Friends and Family: Not on file  . Frequency of Social Gatherings with Friends and Family: Not on file  . Attends Religious Services: Not on file  . Active Member of Clubs or Organizations: Not on file  . Attends Banker Meetings: Not on file  . Marital Status: Not on file  Intimate Partner Violence:   . Fear of Current or Ex-Partner: Not on file  . Emotionally Abused: Not on file  . Physically Abused: Not on file  . Sexually Abused: Not on file    Review of Systems: ROS negative except for what is noted on the assessment and plan.  Vitals:   07/31/20 0925  Temp: 98.2 F (36.8 C)  TempSrc: Oral  SpO2: 100%  Weight: 145 lb (65.8 kg)  Height: 5' (1.524 m)     Physical Exam: Physical Exam Constitutional:      Appearance: Normal appearance.  HENT:     Head: Normocephalic and atraumatic.  Eyes:     Extraocular Movements: Extraocular movements intact.  Cardiovascular:  Rate and Rhythm: Normal rate.     Pulses: Normal pulses.     Heart sounds: Normal heart sounds.  Pulmonary:     Effort: Pulmonary effort is normal.     Breath sounds: Normal breath sounds.  Musculoskeletal:        General: Normal range of motion.     Cervical back: Normal range of motion.     Right lower leg: No edema.     Left lower leg: No edema.  Skin:    General: Skin is warm and dry.  Neurological:     Mental Status: She is alert and oriented to person, place, and time. Mental status is at baseline.  Psychiatric:        Mood and Affect: Mood normal.      Assessment & Plan:   See Encounters Tab for problem based charting.  Patient discussed with Dr. Clinton Gallant, D.O. [facility] Internal Medicine, PGY-2 Pager: [phone], Phone: [phone] Date 08/01/2020 Time 6:43 AM

## 2020-07-31 NOTE — Patient Instructions (Addendum)
Thank you, Ms.Dhamar Garcia-Gonzalez for allowing [place] to provide your care today. Today we discussed diabetes.    I have ordered the following labs for you:   Lab Orders     Glucose, capillary     POC Hbg A1C   Tests ordered today:  none  Referrals ordered today:   Referral Orders  No referral(s) requested today     I have ordered the following medication/changed the following medications:   Stop the following medications: Medications Discontinued During This Encounter  Medication Reason  . metFORMIN (GLUCOPHAGE) 500 MG tablet Reorder  . INVOKANA 100 MG TABS tablet Reorder  . pravastatin (PRAVACHOL) 40 MG tablet Reorder     Start the following medications: Meds ordered this encounter  Medications  . canagliflozin (INVOKANA) 100 MG TABS tablet    Sig: TAKE 1 TABLET (100 MG TOTAL) BY MOUTH DAILY BEFORE BREAKFAST.    Dispense:  30 tablet    Refill:  0    IM program  . metFORMIN (GLUCOPHAGE) 500 MG tablet    Sig: Take 1 tablet (500 mg total) by mouth 2 (two) times daily with a meal. IM program    Dispense:  180 tablet    Refill:  0  . pravastatin (PRAVACHOL) 40 MG tablet    Sig: Take 1 tablet (40 mg total) by mouth daily.    Dispense:  30 tablet    Refill:  0    IM program     Follow up: 3 months    Remember:   Should you have any questions or concerns please call the internal medicine clinic at [phone].     Dellia Cloud, D.O. [facility]

## 2020-08-01 ENCOUNTER — Encounter: Payer: Self-pay | Admitting: Internal Medicine

## 2020-08-01 NOTE — Assessment & Plan Note (Signed)
Patient presents for evaluation of her hyperlipidemia.  Patient is taking pravastatin 40 mg and tolerating it well.  She denies any side effects of the medication.  Plan: -Continue pravastatin 40 mg daily.

## 2020-08-01 NOTE — Assessment & Plan Note (Signed)
Patient presents today for reevaluation of her diabetes.  Patient's A1c has gone from 7.9-7.1 today on Invokana 100 mg, Metformin 500 mg twice daily.  Patient denies any signs or symptoms of hyper or hypoglycemia.  Patient does not have any peripheral neuropathy in her foot exam is within normal limits today.  Patient states that she had an eye exam done in July 2021 in [place].  She was counseled on the importance of annual eye exams.  Plan: -Continue current diabetic medication management. -Patient will need eye exam next year. 

## 2020-08-01 NOTE — Assessment & Plan Note (Deleted)
Patient presents today for reevaluation of her diabetes.  Patient's A1c has gone from 7.9-7.1 today on Invokana 100 mg, Metformin 500 mg twice daily.  Patient denies any signs or symptoms of hyper or hypoglycemia.  Patient does not have any peripheral neuropathy in her foot exam is within normal limits today.  Patient states that she had an eye exam done in July 2021 in [place].  She was counseled on the importance of annual eye exams.  Plan: -Continue current diabetic medication management. -Patient will need eye exam next year.

## 2020-08-07 NOTE — Progress Notes (Signed)
Internal Medicine Clinic Attending  Case discussed with Dr. Coe  At the time of the visit.  We reviewed the resident's history and exam and pertinent patient test results.  I agree with the assessment, diagnosis, and plan of care documented in the resident's note.  

## 2020-09-04 MED FILL — PRAVASTATIN NA 40 MG TAB: 40 | 30 days supply | Qty: 30 | Fill #0

## 2020-09-04 MED FILL — INVOKANA 100 MG TABLET: 100 | 30 days supply | Qty: 30 | Fill #0

## 2020-09-04 MED FILL — METFORMIN HCL 500 MG TABS: 500 | 30 days supply | Qty: 60 | Fill #0

## 2020-09-14 ENCOUNTER — Ambulatory Visit: Payer: Self-pay | Admitting: Internal Medicine

## 2020-09-14 ENCOUNTER — Other Ambulatory Visit: Payer: Self-pay

## 2020-09-14 ENCOUNTER — Encounter: Payer: Self-pay | Admitting: Internal Medicine

## 2020-09-14 VITALS — BP 141/76 | HR 60 | Temp 98.1°F | Ht 60.0 in | Wt 145.6 lb

## 2020-09-14 DIAGNOSIS — N644 Mastodynia: Secondary | ICD-10-CM | POA: Insufficient documentation

## 2020-09-14 DIAGNOSIS — E119 Type 2 diabetes mellitus without complications: Secondary | ICD-10-CM

## 2020-09-14 NOTE — Patient Instructions (Signed)
Gracias por tu cita hoy.   He ordenado una mammografia. Te llamaran con una cita.   Tome tylenol cuando necessita para Chief Technology Officer.   [place] calor y algo frio cuando tienes dolor y lleva una sosten ajusta de forma.   Llama la oficina si tienes preguntas o cambio en su dolor o otras sintomas.

## 2020-09-14 NOTE — Assessment & Plan Note (Signed)
Type II diabetes last seen 08/01/20. She is taking metformin 500 mg bid and invokana 100 mg qd without difficulty. No symptoms of hypoglycemia.   - bmp today, last 06/2019 - will need foot exam at follow-up

## 2020-09-14 NOTE — Progress Notes (Signed)
   CC: breast pain  HPI:  [name] is a 54 y.o. with PMH as below.   Please see A&P for assessment of the patient's acute and chronic medical conditions.   She has been having pain in her left and right breast that moves back and forth for the past two weeks. The pain seems to move around and is tender. She has never had the pain before except when she was younger near her cycle. Her menstrual cycles stopped about four years ago. She has not had any injury or moved anything heavy. She does not have back pain, neck pain, arm pain, numbness, fever, chills, night sweats, weight loss. She has a family history of cervical cancer in her mother. 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 Prior to the imaging she was seen by the breast clinic and had cramping breast pain.   Past Medical History:  Diagnosis Date  . Diabetes mellitus without complication (HCC)   . Hyperlipidemia    Review of Systems:   10 point ROS negative except as noted in HPI  Physical Exam: Constitution: NAD, appears stated age Eyes: no icterus or injection Cardio: RRR, no m/r/g, no LE edema  Respiratory: CTA, no w/r/r Abdominal: NTTP, soft, non-distended MSK: chest wall NTTP, tail of left lateral breast tender at location of 1-1.5cm mobile mass Neuro: normal affect, a&ox3 Skin: c/d/i    Vitals:   09/14/20 1408  BP: (!) 152/91  Pulse: 62  Temp: 98.1 F (36.7 C)  TempSrc: Oral  SpO2: 100%  Weight: 145 lb 9.6 oz (66 kg)  Height: 5' (1.524 m)    Assessment & Plan:   See Encounters Tab for problem based charting.  Patient discussed with Dr. Oswaldo Done

## 2020-09-14 NOTE — Assessment & Plan Note (Addendum)
She has been having pain in her left and right breast that moves back and forth for the past two weeks. The pain seems to move around and is tender. She has never had the pain before except when she was younger near her cycle. Her menstrual cycles stopped about four years ago. She has not had any injury or moved anything heavy. She does not have back pain, neck pain, arm pain, numbness, fever, chills, night sweats, weight loss. She has a family history of cervical cancer in her mother. 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 Prior to the imaging she was seen by the breast clinic and had cramping breast pain. A lateral a mass was palpated in the left lateral breast, not mentioned on mammogram or [place]. Mass is palpated today and tender to palpation. No overlying lesions, rash or other breast masses on exam.   - With history of similar cyclical mastalgia, this is possibly secondary to earlier fibrocystic changes, but will order repeat diagnostic mammogram as I am unable to determine if there is change in size of the lateral left breast lump - CBC - alternate warm and cool compresses, tylenol prn, supportive form fitting bras

## 2020-09-15 ENCOUNTER — Other Ambulatory Visit: Payer: Self-pay | Admitting: Obstetrics and Gynecology

## 2020-09-15 DIAGNOSIS — N644 Mastodynia: Secondary | ICD-10-CM

## 2020-09-15 DIAGNOSIS — N63 Unspecified lump in unspecified breast: Secondary | ICD-10-CM

## 2020-09-15 LAB — BMP8+ANION GAP
Anion Gap: 16 mmol/L (ref 10.0–18.0)
BUN/Creatinine Ratio: 18 (ref 9–23)
BUN: 12 mg/dL (ref 6–24)
CO2: 23 mmol/L (ref 20–29)
Calcium: 10 mg/dL (ref 8.7–10.2)
Chloride: 98 mmol/L (ref 96–106)
Creatinine, Ser: 0.67 mg/dL (ref 0.57–1.00)
GFR calc Af Amer: 114 mL/min/{1.73_m2} (ref >59)
GFR calc non Af Amer: 99 mL/min/{1.73_m2} (ref >59)
Glucose: 116 mg/dL — ABNORMAL HIGH (ref 65–99)
Potassium: 4.7 mmol/L (ref 3.5–5.2)
Sodium: 137 mmol/L (ref 134–144)

## 2020-09-15 LAB — CBC WITH DIFFERENTIAL/PLATELET
Basophils Absolute: 0 10*3/uL (ref 0.0–0.2)
Basos: 0 %
EOS (ABSOLUTE): 0.1 10*3/uL (ref 0.0–0.4)
Eos: 1 %
Hematocrit: 44.3 % (ref 34.0–46.6)
Hemoglobin: 15.4 g/dL (ref 11.1–15.9)
Immature Grans (Abs): 0 10*3/uL (ref 0.0–0.1)
Immature Granulocytes: 0 %
Lymphocytes Absolute: 3.4 10*3/uL — ABNORMAL HIGH (ref 0.7–3.1)
Lymphs: 41 %
MCH: 30 pg (ref 26.6–33.0)
MCHC: 34.8 g/dL (ref 31.5–35.7)
MCV: 86 fL (ref 79–97)
Monocytes Absolute: 0.3 10*3/uL (ref 0.1–0.9)
Monocytes: 4 %
Neutrophils Absolute: 4.4 10*3/uL (ref 1.4–7.0)
Neutrophils: 54 %
Platelets: 312 10*3/uL (ref 150–450)
RBC: 5.14 x10E6/uL (ref 3.77–5.28)
RDW: 12.8 % (ref 11.7–15.4)
WBC: 8.3 10*3/uL (ref 3.4–10.8)

## 2020-09-15 NOTE — Progress Notes (Signed)
Internal Medicine Clinic Attending  Case discussed with Dr. Seawell  At the time of the visit.  We reviewed the resident's history and exam and pertinent patient test results.  I agree with the assessment, diagnosis, and plan of care documented in the resident's note.  

## 2020-10-05 ENCOUNTER — Other Ambulatory Visit: Payer: Self-pay

## 2020-10-05 ENCOUNTER — Ambulatory Visit
Admission: RE | Admit: 2020-10-05 | Discharge: 2020-10-05 | Disposition: A | Discharge status: Home / Self Care | Payer: No Typology Code available for payment source | Source: Ambulatory Visit | Attending: Obstetrics and Gynecology | Admitting: Obstetrics and Gynecology

## 2020-10-05 DIAGNOSIS — N644 Mastodynia: Secondary | ICD-10-CM

## 2020-10-05 DIAGNOSIS — N63 Unspecified lump in unspecified breast: Secondary | ICD-10-CM

## 2020-10-12 ENCOUNTER — Other Ambulatory Visit: Payer: Self-pay | Admitting: Internal Medicine

## 2020-10-12 DIAGNOSIS — E119 Type 2 diabetes mellitus without complications: Secondary | ICD-10-CM

## 2020-10-12 DIAGNOSIS — E1169 Type 2 diabetes mellitus with other specified complication: Secondary | ICD-10-CM

## 2020-10-12 MED FILL — METFORMIN HCL 500 MG TABS: 500 | 30 days supply | Qty: 60 | Fill #1

## 2020-10-13 ENCOUNTER — Other Ambulatory Visit (HOSPITAL_COMMUNITY): Payer: Self-pay | Admitting: Student

## 2020-10-13 MED FILL — INVOKANA 100 MG TABLET: 100 | 30 days supply | Qty: 30 | Fill #0

## 2020-10-13 MED FILL — PRAVASTATIN NA 40 MG TAB: 40 | 30 days supply | Qty: 30 | Fill #0

## 2020-11-16 ENCOUNTER — Other Ambulatory Visit (HOSPITAL_COMMUNITY): Payer: Self-pay | Admitting: Student

## 2020-11-16 ENCOUNTER — Other Ambulatory Visit: Payer: Self-pay | Admitting: Student

## 2020-11-16 DIAGNOSIS — E119 Type 2 diabetes mellitus without complications: Secondary | ICD-10-CM

## 2020-11-16 DIAGNOSIS — E1169 Type 2 diabetes mellitus with other specified complication: Secondary | ICD-10-CM

## 2020-11-16 DIAGNOSIS — E785 Hyperlipidemia, unspecified: Secondary | ICD-10-CM

## 2020-11-16 MED FILL — METFORMIN HCL 500 MG TABS: 500 | 30 days supply | Qty: 60 | Fill #2

## 2020-11-17 ENCOUNTER — Telehealth: Payer: Self-pay

## 2020-11-17 MED FILL — PRAVASTATIN NA 40 MG TAB: 40 | 30 days supply | Qty: 30 | Fill #0

## 2020-11-17 NOTE — Telephone Encounter (Signed)
Both RX's were sent to [facility] via [facility] yesterday. SChaplin, RN,BSN

## 2020-11-17 NOTE — Telephone Encounter (Signed)
INVOKANA 100 MG TABS tablet  pravastatin (PRAVACHOL) 40 MG tablet refill request @  [facility], [place] - [address] [facility]. Phone:  [phone]  Fax:  [phone]

## 2020-11-21 ENCOUNTER — Other Ambulatory Visit (HOSPITAL_COMMUNITY): Payer: Self-pay | Admitting: Student

## 2020-11-21 MED ORDER — CANAGLIFLOZIN 100 MG PO TABS: ORAL_TABLET | ORAL | 0 refills | Status: DC

## 2020-11-21 MED ORDER — PRAVASTATIN SODIUM 40 MG PO TABS
40.0000 mg | ORAL_TABLET | Freq: Every day | ORAL | 0 refills | Status: DC
Start: 2020-11-21 — End: 2021-01-31

## 2020-11-21 MED FILL — INVOKANA 100 MG TABLET: 100 | 30 days supply | Qty: 30 | Fill #0

## 2020-11-21 NOTE — Addendum Note (Signed)
Addended by: Roylene Reason C on: 11/21/2020 01:00 PM   Modules accepted: Orders

## 2020-12-14 ENCOUNTER — Other Ambulatory Visit: Payer: Self-pay | Admitting: Internal Medicine

## 2020-12-14 ENCOUNTER — Other Ambulatory Visit (HOSPITAL_COMMUNITY): Payer: Self-pay | Admitting: Internal Medicine

## 2020-12-14 DIAGNOSIS — E119 Type 2 diabetes mellitus without complications: Secondary | ICD-10-CM

## 2020-12-14 MED ORDER — METFORMIN HCL 500 MG PO TABS: 500.0000 mg | ORAL_TABLET | Freq: Two times a day (BID) | ORAL | 0 refills | Status: DC

## 2020-12-14 MED FILL — METFORMIN HCL 500 MG TABS: 500 | 30 days supply | Qty: 60 | Fill #0

## 2020-12-14 MED FILL — PRAVASTATIN NA 40 MG TAB: 40 | 30 days supply | Qty: 30 | Fill #0

## 2020-12-15 ENCOUNTER — Other Ambulatory Visit (HOSPITAL_COMMUNITY): Payer: Self-pay | Admitting: Internal Medicine

## 2020-12-15 ENCOUNTER — Other Ambulatory Visit: Payer: Self-pay | Admitting: Internal Medicine

## 2020-12-15 DIAGNOSIS — E119 Type 2 diabetes mellitus without complications: Secondary | ICD-10-CM

## 2020-12-15 MED ORDER — CANAGLIFLOZIN 100 MG PO TABS: ORAL_TABLET | ORAL | 0 refills | Status: DC

## 2020-12-15 MED FILL — INVOKANA 100 MG TABLET: 100 | 30 days supply | Qty: 30 | Fill #0

## 2021-01-18 ENCOUNTER — Other Ambulatory Visit: Payer: Self-pay | Admitting: Student

## 2021-01-18 ENCOUNTER — Other Ambulatory Visit (HOSPITAL_COMMUNITY): Payer: Self-pay

## 2021-01-18 ENCOUNTER — Other Ambulatory Visit: Payer: Self-pay | Admitting: Internal Medicine

## 2021-01-19 ENCOUNTER — Other Ambulatory Visit (HOSPITAL_COMMUNITY): Payer: Self-pay

## 2021-01-22 ENCOUNTER — Other Ambulatory Visit (HOSPITAL_COMMUNITY): Payer: Self-pay

## 2021-01-22 MED ORDER — METFORMIN HCL 500 MG PO TABS
500.0000 mg | ORAL_TABLET | Freq: Two times a day (BID) | ORAL | 0 refills | Status: DC
  Filled 2021-01-22 – 2021-01-30 (×3): qty 60, 30d supply, fill #0

## 2021-01-22 MED ORDER — PRAVASTATIN SODIUM 40 MG PO TABS
40.0000 mg | ORAL_TABLET | Freq: Every day | ORAL | 0 refills | Status: DC
  Filled 2021-01-22: qty 30, 30d supply, fill #0

## 2021-01-22 MED ORDER — CANAGLIFLOZIN 100 MG PO TABS
100.0000 mg | ORAL_TABLET | Freq: Every day | ORAL | 0 refills | Status: DC
  Filled 2021-01-22: qty 30, 30d supply, fill #0

## 2021-01-23 ENCOUNTER — Other Ambulatory Visit (HOSPITAL_COMMUNITY): Payer: Self-pay

## 2021-01-29 ENCOUNTER — Other Ambulatory Visit (HOSPITAL_COMMUNITY): Payer: Self-pay

## 2021-01-29 ENCOUNTER — Encounter: Payer: Self-pay | Admitting: Internal Medicine

## 2021-01-29 ENCOUNTER — Other Ambulatory Visit: Payer: Self-pay | Admitting: Internal Medicine

## 2021-01-29 ENCOUNTER — Ambulatory Visit (INDEPENDENT_AMBULATORY_CARE_PROVIDER_SITE_OTHER): Payer: 59 | Admitting: Internal Medicine

## 2021-01-29 ENCOUNTER — Other Ambulatory Visit: Payer: Self-pay

## 2021-01-29 VITALS — BP 127/69 | HR 57 | Temp 98.8°F | Ht 60.0 in | Wt 144.8 lb

## 2021-01-29 DIAGNOSIS — E119 Type 2 diabetes mellitus without complications: Secondary | ICD-10-CM | POA: Diagnosis not present

## 2021-01-29 DIAGNOSIS — E1169 Type 2 diabetes mellitus with other specified complication: Secondary | ICD-10-CM | POA: Diagnosis not present

## 2021-01-29 DIAGNOSIS — E785 Hyperlipidemia, unspecified: Secondary | ICD-10-CM

## 2021-01-29 DIAGNOSIS — Z1231 Encounter for screening mammogram for malignant neoplasm of breast: Secondary | ICD-10-CM

## 2021-01-29 LAB — POCT GLYCOSYLATED HEMOGLOBIN (HGB A1C): Hemoglobin A1C: 7.1 % — AB (ref 4.0–5.6)

## 2021-01-29 LAB — GLUCOSE, CAPILLARY: Glucose-Capillary: 164 mg/dL — ABNORMAL HIGH (ref 70–99)

## 2021-01-29 MED ORDER — CANAGLIFLOZIN 100 MG PO TABS
100.0000 mg | ORAL_TABLET | Freq: Every day | ORAL | 0 refills | Status: DC
  Filled 2021-01-29: qty 30, fill #0
  Filled 2021-01-30 – 2021-03-01 (×2): qty 30, 30d supply, fill #0

## 2021-01-29 MED ORDER — PRAVASTATIN SODIUM 40 MG PO TABS
40.0000 mg | ORAL_TABLET | Freq: Every day | ORAL | 0 refills | Status: DC
  Filled 2021-01-29: qty 30, fill #0
  Filled 2021-01-30: qty 30, 30d supply, fill #0

## 2021-01-29 NOTE — Telephone Encounter (Signed)
Please resend with IM Program in Comments.  Also, please remove duplicates from med list. Thank you.

## 2021-01-29 NOTE — Patient Instructions (Signed)
Thank you, Ms.Adalay Garcia-Gonzalez for allowing [place] to provide your care today. Today we discussed Diabetes.    I have ordered the following labs for you:   Lab Orders     BMP8+Anion Gap     Microalbumin / Creatinine Urine Ratio     Glucose, capillary     POC Hbg A1C   Tests ordered today:   Referrals ordered today:   Referral Orders  No referral(s) requested today     Medication Changes:   Medications Discontinued During This Encounter  Medication Reason  . canagliflozin (INVOKANA) 100 MG TABS tablet Reorder  . pravastatin (PRAVACHOL) 40 MG tablet Reorder     Meds ordered this encounter  Medications  . pravastatin (PRAVACHOL) 40 MG tablet    Sig: TAKE 1 TABLET BY MOUTH ONCE DAILY.    Dispense:  30 tablet    Refill:  0  . canagliflozin (INVOKANA) 100 MG TABS tablet    Sig: TAKE 1 TABLET (100 MG TOTAL) BY MOUTH DAILY BEFORE BREAKFAST.    Dispense:  30 tablet    Refill:  0     Instructions:   Follow up: 3 months   Remember:    Should you have any questions or concerns please call the internal medicine clinic at [phone].     Dellia Cloud, D.O. [facility]

## 2021-01-29 NOTE — Progress Notes (Signed)
CC: DM  HPI:  [name] is a 55 y.o. female with a past medical history stated below and presents today for DM. Please see problem based assessment and plan for additional details.  Past Medical History:  Diagnosis Date  . Diabetes mellitus without complication (HCC)   . Hyperlipidemia     Current Outpatient Medications on File Prior to Visit  Medication Sig Dispense Refill  . canagliflozin (INVOKANA) 100 MG TABS tablet TAKE 1 TABLET (100 MG TOTAL) BY MOUTH DAILY BEFORE BREAKFAST. 30 tablet 0  . canagliflozin (INVOKANA) 100 MG TABS tablet TAKE 1 TABLET (100 MG TOTAL) BY MOUTH DAILY BEFORE BREAKFAST. 30 tablet 0  . famotidine (PEPCID) 20 MG tablet Take 1 tablet (20 mg total) by mouth 2 (two) times daily for 5 days. 10 tablet 0  . loratadine (CLARITIN) 10 MG tablet Take 1 tablet (10 mg total) by mouth daily for 7 days. 7 tablet 0  . metFORMIN (GLUCOPHAGE) 500 MG tablet Take 1 tablet (500 mg total) by mouth 2 (two) times daily with a meal. 60 tablet 0  . metFORMIN (GLUCOPHAGE) 500 MG tablet TAKE 1 TABLET BY MOUTH TWICE DAILY WITH MEALS. 180 tablet 0  . metFORMIN (GLUCOPHAGE) 500 MG tablet TAKE 1 TABLET BY MOUTH TWICE DAILY WITH A MEAL. 180 tablet 0  . metFORMIN (GLUCOPHAGE) 500 MG tablet TAKE 1 TABLET (500 MG TOTAL) BY MOUTH 2 (TWO) TIMES DAILY WITH A MEAL. 60 tablet 0  . pravastatin (PRAVACHOL) 40 MG tablet Take 1 tablet (40 mg total) by mouth daily. 30 tablet 0  . pravastatin (PRAVACHOL) 40 MG tablet TAKE 1 TABLET (40 MG TOTAL) BY MOUTH DAILY. 30 tablet 0   No current facility-administered medications on file prior to visit.    Family History  Problem Relation Age of Onset  . Cancer Mother        colon and uterine  . Hypertension Sister   . Migraines Sister   . Diabetes Brother   . Stroke Brother   . Breast cancer Neg Hx     Social History   Socioeconomic History  . Marital status: Married    Spouse name: Not on file  . Number of children: 1  . Years of  education: Not on file  . Highest education level: 6th grade  Occupational History  . Not on file  Tobacco Use  . Smoking status: Never Smoker  . Smokeless tobacco: Never Used  Vaping Use  . Vaping Use: Never used  Substance and Sexual Activity  . Alcohol use: Never  . Drug use: Never  . Sexual activity: Yes    Birth control/protection: None, Post-menopausal  Other Topics Concern  . Not on file  Social History Narrative  . Not on file   Social Determinants of Health   Financial Resource Strain: Not on file  Food Insecurity: Not on file  Transportation Needs: No Transportation Needs  . Lack of Transportation (Medical): No  . Lack of Transportation (Non-Medical): No  Physical Activity: Not on file  Stress: Not on file  Social Connections: Not on file  Intimate Partner Violence: Not on file    Review of Systems: ROS negative except for what is noted on the assessment and plan.  Vitals:   01/29/21 1024  BP: 127/69  Pulse: (!) 57  Temp: 98.8 F (37.1 C)  TempSrc: Oral  SpO2: 99%  Weight: 144 lb 12.8 oz (65.7 kg)  Height: 5' (1.524 m)    Physical Exam: Gen: A&O x3  and in no apparent distress, well appearing and nourished. HEENT: Head - normocephalic, atraumatic. Eye -  visual acuity grossly intact, conjunctiva clear, sclera non-icteric, EOM intact. Mouth - No obvious caries or periodontal disease. Neck: no obvious masses or nodules, AROM intact. CV: RRR, no murmurs, rubs, or gallops. S1/S2 presents  Resp: Clear to ascultation bilaterally  Abd: BS (+) x4, soft, non-tender, without obvious hepatosplenomegaly or masses MSK: Grossly normal AROM and strength x4 extremities. Skin: good skin turgor, no rashes, unusual bruising, or prominent lesions.  Neuro: No focal deficits, grossly normal sensation and coordination.  Psych: Oriented x3 and responding appropriately. Intact recent and remote memory, normal mood, judgement, affect , and insight.    Assessment & Plan:    See Encounters Tab for problem based charting.  Patient discussed with Dr. Gustavo Lah, D.O. [facility] Internal [facility], PGY-2 Pager: [phone], Phone: [phone] Date 01/31/2021 Time 10:38 AM

## 2021-01-30 ENCOUNTER — Other Ambulatory Visit (HOSPITAL_COMMUNITY): Payer: Self-pay

## 2021-01-30 LAB — BMP8+ANION GAP
Anion Gap: 16 mmol/L (ref 10.0–18.0)
BUN/Creatinine Ratio: 21 (ref 9–23)
BUN: 15 mg/dL (ref 6–24)
CO2: 21 mmol/L (ref 20–29)
Calcium: 9.4 mg/dL (ref 8.7–10.2)
Chloride: 103 mmol/L (ref 96–106)
Creatinine, Ser: 0.7 mg/dL (ref 0.57–1.00)
Glucose: 168 mg/dL — ABNORMAL HIGH (ref 65–99)
Potassium: 4.5 mmol/L (ref 3.5–5.2)
Sodium: 140 mmol/L (ref 134–144)
eGFR: 101 mL/min/{1.73_m2} (ref >59)

## 2021-01-31 ENCOUNTER — Encounter: Payer: Self-pay | Admitting: Internal Medicine

## 2021-01-31 ENCOUNTER — Other Ambulatory Visit (HOSPITAL_COMMUNITY): Payer: Self-pay

## 2021-01-31 MED ORDER — PRAVASTATIN SODIUM 40 MG PO TABS
40.0000 mg | ORAL_TABLET | Freq: Every day | ORAL | 2 refills | Status: DC
  Filled 2021-01-31 – 2021-03-01 (×2): qty 30, 30d supply, fill #0
  Filled 2021-03-30: qty 30, 30d supply, fill #1
  Filled 2021-05-08: qty 30, 30d supply, fill #2

## 2021-01-31 MED ORDER — METFORMIN HCL 500 MG PO TABS
500.0000 mg | ORAL_TABLET | Freq: Two times a day (BID) | ORAL | 2 refills | Status: DC
  Filled 2021-01-31 – 2021-03-01 (×2): qty 60, 30d supply, fill #0
  Filled 2021-03-30: qty 60, 30d supply, fill #1
  Filled 2021-05-08: qty 60, 30d supply, fill #2

## 2021-01-31 NOTE — Assessment & Plan Note (Signed)
SUBJECTIVE: Patient presents for further evaluation and management of her DM.   Current DM medications:   1. Metformin 500 mg BID 2. Canagliflozin 100 mg daily  Medication Adherence: The patient reports adherence to this regimen.  Medication SE: none Barriers: none identified.   Monitoring: does not have a home glucometer.  DM S/Sx: Admits to none. Denies foot ulcerations, paresthesia of the feet, polydipsia, polyuria, visual disturbances, vomitting and weight loss.   Exercise: walks 2-3x weekly for 30-45 min Diet: Admits to poor appetite with primarily meat.  Denies fruits and veggies in her diet as she states she does not enjoy them.  Eye Exam: no recorded eye exams Foot Exam: unknown last foot exam  Labs: Glucose-Capillary  Date/Time Value Ref Range Status  01/29/2021 11:00 AM 164 (H) 70 - 99 mg/dL Final    Comment:    Glucose reference range applies only to samples taken after fasting for at least 8 hours.    Last A1c was  Lab Results  Component Value Date   HGBA1C 7.1 (A) 01/29/2021  .   ASSESSMENT: controlled Type 2 Diabetes Mellitus without long term use of insulin without end-organ damage (none)   PLAN: 1. Counseling: A. Counseled regarding dietary modifications B. Weight loss of five to seven percent of body weight at a rate of 0.5 to 1.0 kg per week (1 to 2 lb per week). C. Moderate-intensity aerobic exercise for 30-60 minutes most days. 2. Medications: Continue current medications. Refilled today. 3. Goals:  A. exercise 4-5 days/week increase veggies and fruits in diet B. CBG - 100-120  C. Hgb A1c: <7 4. Referrals: none 5. Follow-up: 3 months

## 2021-02-02 NOTE — Progress Notes (Signed)
Internal Medicine Clinic Attending  Case discussed with Dr. Coe  At the time of the visit.  We reviewed the resident's history and exam and pertinent patient test results.  I agree with the assessment, diagnosis, and plan of care documented in the resident's note.  

## 2021-02-15 ENCOUNTER — Ambulatory Visit
Admission: RE | Admit: 2021-02-15 | Discharge: 2021-02-15 | Disposition: A | Discharge status: Home / Self Care | Payer: 59 | Source: Ambulatory Visit | Attending: Obstetrics and Gynecology | Admitting: Obstetrics and Gynecology

## 2021-02-15 ENCOUNTER — Ambulatory Visit: Payer: 59

## 2021-02-15 ENCOUNTER — Other Ambulatory Visit: Payer: Self-pay

## 2021-02-15 NOTE — Progress Notes (Deleted)
Error

## 2021-03-01 ENCOUNTER — Other Ambulatory Visit (HOSPITAL_COMMUNITY): Payer: Self-pay

## 2021-03-03 ENCOUNTER — Encounter: Payer: Self-pay | Admitting: *Deleted

## 2021-03-30 ENCOUNTER — Other Ambulatory Visit (HOSPITAL_COMMUNITY): Payer: Self-pay

## 2021-05-08 ENCOUNTER — Other Ambulatory Visit (HOSPITAL_COMMUNITY): Payer: Self-pay

## 2021-05-08 MED FILL — Canagliflozin Tab 100 MG: ORAL | 30 days supply | Qty: 30 | Fill #0 | Status: AC

## 2021-06-19 ENCOUNTER — Other Ambulatory Visit (HOSPITAL_COMMUNITY): Payer: Self-pay

## 2021-06-19 ENCOUNTER — Other Ambulatory Visit: Payer: Self-pay | Admitting: Internal Medicine

## 2021-06-19 DIAGNOSIS — E1169 Type 2 diabetes mellitus with other specified complication: Secondary | ICD-10-CM

## 2021-06-20 ENCOUNTER — Other Ambulatory Visit (HOSPITAL_COMMUNITY): Payer: Self-pay

## 2021-06-20 ENCOUNTER — Other Ambulatory Visit: Payer: Self-pay | Admitting: Internal Medicine

## 2021-06-20 DIAGNOSIS — E1169 Type 2 diabetes mellitus with other specified complication: Secondary | ICD-10-CM

## 2021-06-20 DIAGNOSIS — E785 Hyperlipidemia, unspecified: Secondary | ICD-10-CM

## 2021-06-21 ENCOUNTER — Other Ambulatory Visit: Payer: Self-pay | Admitting: Internal Medicine

## 2021-06-21 ENCOUNTER — Other Ambulatory Visit (HOSPITAL_COMMUNITY): Payer: Self-pay

## 2021-06-21 DIAGNOSIS — E119 Type 2 diabetes mellitus without complications: Secondary | ICD-10-CM

## 2021-06-21 DIAGNOSIS — E1169 Type 2 diabetes mellitus with other specified complication: Secondary | ICD-10-CM

## 2021-06-21 DIAGNOSIS — E785 Hyperlipidemia, unspecified: Secondary | ICD-10-CM

## 2021-06-21 MED ORDER — DAPAGLIFLOZIN PROPANEDIOL 5 MG PO TABS
5.0000 mg | ORAL_TABLET | Freq: Every day | ORAL | 3 refills | Status: DC
  Filled 2021-06-21: qty 30, 30d supply, fill #0

## 2021-06-21 MED ORDER — PRAVASTATIN SODIUM 40 MG PO TABS
40.0000 mg | ORAL_TABLET | Freq: Every day | ORAL | 2 refills | Status: DC
  Filled 2021-06-21: qty 30, 30d supply, fill #0

## 2021-06-21 MED ORDER — METFORMIN HCL 500 MG PO TABS
500.0000 mg | ORAL_TABLET | Freq: Two times a day (BID) | ORAL | 2 refills | Status: DC
  Filled 2021-06-21: qty 60, 30d supply, fill #0

## 2021-06-21 MED ORDER — CANAGLIFLOZIN 100 MG PO TABS
100.0000 mg | ORAL_TABLET | Freq: Every day | ORAL | 0 refills | Status: DC
  Filled 2021-06-21: qty 30, 30d supply, fill #0

## 2021-06-21 NOTE — Addendum Note (Signed)
Addended by: Dolan Amen C on: 06/21/2021 01:26 PM   Modules accepted: Orders

## 2021-06-21 NOTE — Telephone Encounter (Signed)
Refill Request-  Pt states she has  been waiting for the following medications to be filled.  canagliflozin (INVOKANA) 100 MG TABS tablet  metFORMIN (GLUCOPHAGE) 500 MG tablet pravastatin (PRAVACHOL) 40 MG tablet   [facility] (Ph: [phone])

## 2021-06-29 ENCOUNTER — Other Ambulatory Visit (HOSPITAL_COMMUNITY): Payer: Self-pay

## 2021-07-25 ENCOUNTER — Other Ambulatory Visit (HOSPITAL_COMMUNITY): Payer: Self-pay

## 2021-07-25 ENCOUNTER — Encounter: Payer: Self-pay | Admitting: Internal Medicine

## 2021-07-25 ENCOUNTER — Ambulatory Visit (INDEPENDENT_AMBULATORY_CARE_PROVIDER_SITE_OTHER): Payer: 59 | Admitting: Internal Medicine

## 2021-07-25 ENCOUNTER — Other Ambulatory Visit: Payer: Self-pay

## 2021-07-25 VITALS — BP 130/67 | HR 59 | Temp 98.0°F | Ht 60.0 in | Wt 144.7 lb

## 2021-07-25 DIAGNOSIS — E1169 Type 2 diabetes mellitus with other specified complication: Secondary | ICD-10-CM

## 2021-07-25 DIAGNOSIS — E119 Type 2 diabetes mellitus without complications: Secondary | ICD-10-CM

## 2021-07-25 DIAGNOSIS — Z0000 Encounter for general adult medical examination without abnormal findings: Secondary | ICD-10-CM

## 2021-07-25 DIAGNOSIS — Z1211 Encounter for screening for malignant neoplasm of colon: Secondary | ICD-10-CM

## 2021-07-25 DIAGNOSIS — E785 Hyperlipidemia, unspecified: Secondary | ICD-10-CM

## 2021-07-25 DIAGNOSIS — N898 Other specified noninflammatory disorders of vagina: Secondary | ICD-10-CM | POA: Diagnosis not present

## 2021-07-25 LAB — POCT GLYCOSYLATED HEMOGLOBIN (HGB A1C): Hemoglobin A1C: 7.9 % — AB (ref 4.0–5.6)

## 2021-07-25 LAB — GLUCOSE, CAPILLARY: Glucose-Capillary: 169 mg/dL — ABNORMAL HIGH (ref 70–99)

## 2021-07-25 MED ORDER — PRAVASTATIN SODIUM 40 MG PO TABS
40.0000 mg | ORAL_TABLET | Freq: Every day | ORAL | 2 refills | Status: DC
  Filled 2021-07-25: qty 30, 30d supply, fill #0
  Filled 2021-08-23 (×2): qty 30, 30d supply, fill #1
  Filled 2021-09-17: qty 30, 30d supply, fill #2

## 2021-07-25 MED ORDER — METFORMIN HCL 500 MG PO TABS
1000.0000 mg | ORAL_TABLET | Freq: Two times a day (BID) | ORAL | 4 refills | Status: DC
  Filled 2021-07-25: qty 120, 30d supply, fill #0
  Filled 2021-10-24: qty 120, 30d supply, fill #1
  Filled 2021-11-28: qty 60, 15d supply, fill #2

## 2021-07-25 NOTE — Progress Notes (Signed)
  CC: Routine health visit  HPI:  [name] is a 55 y.o. female with a past medical history stated below and presents today for routine health visit. Please see problem based assessment and plan for additional details.  Patient is Spanish-speaking.  An in person Spanish speaking interpreter assisted with today's visit.  Past Medical History:  Diagnosis Date   Diabetes mellitus without complication (HCC)    Hyperlipidemia     Current Outpatient Medications on File Prior to Visit  Medication Sig Dispense Refill   dapagliflozin propanediol (FARXIGA) 5 MG TABS tablet Take 1 tablet (5 mg total) by mouth daily before breakfast. 30 tablet 3   famotidine (PEPCID) 20 MG tablet Take 1 tablet (20 mg total) by mouth 2 (two) times daily for 5 days. 10 tablet 0   loratadine (CLARITIN) 10 MG tablet Take 1 tablet (10 mg total) by mouth daily for 7 days. 7 tablet 0   No current facility-administered medications on file prior to visit.    Family History  Problem Relation Age of Onset   Cancer Mother        colon and uterine   Hypertension Sister    Migraines Sister    Diabetes Brother    Stroke Brother    Breast cancer Neg Hx    Social History: Patient is Spanish-speaking, she reports increased tortilla consumption and poor vegetable consumption.  She reports has not been walking for exercise recently as she has previously.  Review of Systems: ROS negative except for what is noted on the assessment and plan.  Vitals:   07/25/21 0943  BP: 130/67  Pulse: (!) 59  Temp: 98 F (36.7 C)  TempSrc: Oral  SpO2: 100%  Weight: 144 lb 11.2 oz (65.6 kg)  Height: 5' (1.524 m)     Physical Exam: General: Well appearing Latin American female, NAD HENT: normocephalic, atraumatic, MMM EYES: conjunctiva non-erythematous, no scleral icterus CV: bradycardic, normal rhythm, no murmurs, rubs, gallops. No LEE. Pedal pulses palpable. Pulmonary: Normal work of breathing RA, lungs clear to  auscultation, no rales, wheezes, rhonchi Abdominal: non-distended, soft, non-tender to palpation, normal BS Skin: Warm and dry, no rashes or lesions Neurological: MS: awake, alert and oriented x3, normal speech and fund of knowledge Motor: moves all extremities antigravity Psych: normal affect  External Genital Exam deferred today  Assessment & Plan:   See Encounters Tab for problem based charting.  Patient seen with Dr. Gwendalyn Ege, M.D. [facility], PGY-1 Pager: [phone] Date 07/25/2021 Time 1:46 PM

## 2021-07-25 NOTE — Patient Instructions (Signed)
Carly [name]. [name] por permitirnos brindarle su atencin hoy. Hoy discutimos:  Diabetes mellitus tipo 2: Nos gustara que aumentara su dosis de metformina a 1000 mg [employer]. En este momento, no creemos que necesite tomar [place]. Hoy revisamos su trabajo de laboratorio y su hemoglobina A1c es 7.9 %, que aument de 7.1 %.  Molestias vaginales: Nos gustara que regrese en 1 semana para hacerse un Papanicolaou, y en ese momento podemos hacer un examen externo de la piel y volver a tratar las molestias que ha tenido en los ltimos 5 meses.  He ordenado los siguientes laboratorios para usted:  rdenes de Therapist, sports microalbmina/creatinina en orina      POC Hbg A1C    Llamar si alguno es anormal. Se puede acceder a todos sus laboratorios a travs de "My Chart".  Acceso a mi grfico: [email or web address]?  Haga un seguimiento en 1 semana para la prueba de Papanicolaou.  Asegrese de llegar 15 minutos antes de su prxima cita. Si llega tarde, es posible que se Armed forces operational officer.  Esperamos verte la prxima vez. Llame a nuestra clnica al [phone] si tiene alguna pregunta o inquietud. El mejor horario para llamar es de lunes a viernes de 9 a. m. a 4 p. m., pero hay alguien disponible las 24 horas del da, los 7 [address]  [address] de la [place]. Si es fuera del horario de atencin o durante el fin de Tolstoy, llame al nmero principal del hospital y pregunte por el residente de guardia de medicina interna. Si necesita reposicin de medicamentos, por favor notifique a su farmacia con una semana de anticipacin y ellos nos enviarn una solicitud.  Gracias por dejarnos participar en su cuidado. Deseandote lo mejor!  Dra. Ellison Carwin 19/07/2021, 10:45 Residente MI, PGY-1

## 2021-07-25 NOTE — Assessment & Plan Note (Addendum)
Patient presents today for diabetes follow-up visit.  Patient additionally has acute complaint of vaginal itching.  Patient has history of prior vaginal itching and was found to have yeast infection in 2019.  Patient describes discomfort started 5 months ago, where she has intermittent itching, irritation and burning at the vaginal opening.  She says intermittently she will wipe and see small amounts of blood on the tissue.  Her partner says externally the area looks normal.  She denies dysuria, change in discharge.  She endorses some burning pain with intercourse.  She has not had a period in 4 to 5 years and denies abnormal bleeding.  She has not had this symptom in several days.  She denies fever and painful lymphadenopathy.  Patient is overdue for Pap exam.  We will defer external genital exam today.  Plan: We will schedule her for follow-up appointment in 1 week for Pap smear, exam and further evaluation of vaginal itching.

## 2021-07-25 NOTE — Assessment & Plan Note (Addendum)
Patient currently has insurance coverage through [facility] health, however reports that she will lose coverage with [employer] soon. For this reason we have attempted to address many of her care gaps today.  Plan: -Patient declined flu shot, HIV testing today will postpone -FOBT test kit ordered and picked up by patient today -Foot exam completed today -Urine microalbumin completed today -referral placed to ophthalmology for eye exam -patient is low risk for HCV -Consider re-addressing importance of flu, covid19, shingles, pneumococcal, tetanus vaccination at future appointments

## 2021-07-25 NOTE — Assessment & Plan Note (Signed)
Patient presents today to clinic for diabetes follow-up.  Last hemoglobin A1c completed 01/2021 was 7.1%.  Patient has been on metformin 500 mg twice daily and was prescribed Farxiga, though patient reports she has not yet picked up [place] at her pharmacy.  Previously she has been prescribed Invokana though reports insurance no longer covers this medication, and Marcelline Deist was listed as the alternative.  Today patient endorses polyuria and polydipsia.  She denies symptoms of hypoglycemia. She denies foot numbness, foot ulcers.  She endorses recent changes in vision, is having more difficulty with distance vision.  She has not seen an ophthalmologist in over a year and is due for an exam.  Patient admits she has been eating more tortillas and is having a hard time incorporating vegetables into her diet.  We discussed decreasing rice and tortilla consumption and increasing vegetable and lean protein consumption.  She reports she has not been walking for exercise as much as she used to.  We discussed getting back into a walking schedule.  Hemoglobin A1c today 7.9%.  Given increase in hemoglobin A1c we will make medication changes today as well as continue to encourage lifestyle modifications.  Plan: -Increase metformin to 1000 mg twice daily -Discontinue Farxiga for now -Hemoglobin A1c, blood glucose POC, urine microalbumin completed today -Referral to ophthalmology for diabetic eye exam -Foot exam completed today -Patient will need follow-up appointment in 3 months for diabetes follow-up

## 2021-07-26 LAB — MICROALBUMIN / CREATININE URINE RATIO
Creatinine, Urine: 167.7 mg/dL
Microalb/Creat Ratio: 10 mg/g creat (ref 0–29)
Microalbumin, Urine: 16.9 ug/mL

## 2021-07-26 NOTE — Addendum Note (Signed)
Addended by: Erlinda Hong T on: 07/26/2021 08:41 AM   Modules accepted: Level of Service

## 2021-07-26 NOTE — Progress Notes (Signed)
Internal Medicine Clinic Attending ° °I saw and evaluated the patient.  I personally confirmed the key portions of the history and exam documented by Dr. Zinoviev and I reviewed pertinent patient test results.  The assessment, diagnosis, and plan were formulated together and I agree with the documentation in the resident’s note.  °

## 2021-08-01 ENCOUNTER — Encounter: Payer: Self-pay | Admitting: Internal Medicine

## 2021-08-01 ENCOUNTER — Other Ambulatory Visit (HOSPITAL_COMMUNITY)
Admission: RE | Admit: 2021-08-01 | Discharge: 2021-08-01 | Disposition: A | Discharge status: Home / Self Care | Payer: 59 | Source: Ambulatory Visit | Attending: Internal Medicine | Admitting: Internal Medicine

## 2021-08-01 ENCOUNTER — Ambulatory Visit (INDEPENDENT_AMBULATORY_CARE_PROVIDER_SITE_OTHER): Payer: 59 | Admitting: Internal Medicine

## 2021-08-01 VITALS — BP 127/71 | HR 65 | Temp 98.1°F | Ht 60.0 in | Wt 144.6 lb

## 2021-08-01 DIAGNOSIS — Z124 Encounter for screening for malignant neoplasm of cervix: Secondary | ICD-10-CM

## 2021-08-01 DIAGNOSIS — Z1211 Encounter for screening for malignant neoplasm of colon: Secondary | ICD-10-CM | POA: Diagnosis not present

## 2021-08-01 NOTE — Assessment & Plan Note (Signed)
Patient presents to [facility] today for pap smear and f/u for vaginal itching. Patient reports resolution of itching symptoms since discontinuing Invokana 2-3 weeks ago. Discussed other potential causes of vaginal itching and discomfort such as vaginal atrophy given report of dyspareunia. Patient had normal pelvic exam with moist vaginal tissue, low concern for vaginal atrophy today.   Plan: -Pap smear cytology with HPV completed today  -Patient would like to be called with Pap results, needs spanish interpreter -Wants mailed copy of results if possible -f/u in 3 months for diabetes f/u

## 2021-08-01 NOTE — Progress Notes (Signed)
  CC: pap smear and f/u vaginal itching  HPI:  [name] is a 55 y.o. female with a past medical history stated below and presents today for pap smear and f/u for vaginal itching. Please see problem based assessment and plan for additional details.  Past Medical History:  Diagnosis Date   Diabetes mellitus without complication (HCC)    Hyperlipidemia     Current Outpatient Medications on File Prior to Visit  Medication Sig Dispense Refill   famotidine (PEPCID) 20 MG tablet Take 1 tablet (20 mg total) by mouth 2 (two) times daily for 5 days. 10 tablet 0   loratadine (CLARITIN) 10 MG tablet Take 1 tablet (10 mg total) by mouth daily for 7 days. 7 tablet 0   metFORMIN (GLUCOPHAGE) 500 MG tablet Take 2 tablets (1,000 mg total) by mouth 2 (two) times daily with a meal. 60 tablet 4   pravastatin (PRAVACHOL) 40 MG tablet Take 1 tablet (40 mg total) by mouth daily. 30 tablet 2   No current facility-administered medications on file prior to visit.    Family History  Problem Relation Age of Onset   Cancer Mother        colon and uterine   Hypertension Sister    Migraines Sister    Diabetes Brother    Stroke Brother    Breast cancer Neg Hx     Review of Systems: ROS negative except for what is noted on the assessment and plan.  Vitals:   08/01/21 0931  BP: 127/71  Pulse: 65  Temp: 98.1 F (36.7 C)  TempSrc: Oral  SpO2: 100%  Weight: 144 lb 9.6 oz (65.6 kg)  Height: 5' (1.524 m)     Physical Exam: General: Well appearing latin [place] female, well nourished, NAD HENT: normocephalic, atraumatic EYES: conjunctiva non-erythematous, no scleral icterus Pulmonary: normal work of breathing on RA, no use of accessory muscles  Abdominal: appears non-distended, non-scaphoid Skin: Warm and dry, no rashes or lesions on exposed surfaces PELVIC:  Normally developed genitalia without eruptions or lesions. Moist vaginal tissue. Vagina and cervix without lesions,  inflammation or discharge.  Neurological: MS: awake, alert and oriented x3, normal speech and fund of knowledge Motor: moves all extremities antigravity Psych: normal affect    Assessment & Plan:   See Encounters Tab for problem based charting.  Patient seen with Dr. Abundio Miu, M.D. [facility] Internal Medicine, PGY-1 Pager: [phone] Date 08/01/2021 Time 9:14 PM

## 2021-08-01 NOTE — Patient Instructions (Signed)
Joesph July. [name] por permitirnos brindarle su atencin hoy. Hoy discutimos:  Sntomas de picazn: Estamos felices de que los sntomas de picazn se hayan resuelto. Es posible que la [employer] haya contribuido a los sntomas.  Hoy completamos una prueba de Papanicolaou para la deteccin del cncer de cuello uterino. Llamar con un intrprete con los [place] y ver si podemos enviar los resultados por correo, que probablemente estarn en ingls.   Acceso a mi grfico: [email or web address]?  Por favor, haga un seguimiento en 3 meses para el seguimiento de la diabetes.  Asegrese de llegar 15 minutos antes de su prxima cita. Si llega tarde, es posible que se Armed forces operational officer.  Esperamos verte la prxima vez. Llame a nuestra clnica al [phone] si tiene alguna pregunta o inquietud. El mejor horario para llamar es de lunes a viernes de 9 a. m. a 4 p. m., pero hay alguien disponible las 24 horas del da, los 7 [address]  [address] de la [place]. Si es fuera del horario de atencin o durante el fin de [place], llame al nmero principal del hospital y pregunte por el residente de guardia de medicina interna. Si necesita reposicin de medicamentos, por favor notifique a su farmacia con una semana de anticipacin y ellos nos enviarn una solicitud.  Gracias por dejarnos participar en su cuidado. Deseandote lo mejor!  Dra. Ellison Carwin 26/07/2021, 10:05 Residente MI, PGY-1

## 2021-08-02 LAB — CYTOLOGY - PAP
Comment: NEGATIVE
Diagnosis: NEGATIVE
High risk HPV: NEGATIVE

## 2021-08-03 LAB — FECAL OCCULT BLOOD, IMMUNOCHEMICAL: Fecal Occult Bld: NEGATIVE

## 2021-08-08 ENCOUNTER — Telehealth: Payer: Self-pay

## 2021-08-08 NOTE — Telephone Encounter (Signed)
Pt is requesting a call back  about her lab results  from her last office visit ..    ( Needs translator when calling )

## 2021-08-09 NOTE — Telephone Encounter (Signed)
Called and discussed negative PAP smear result with patient using interpreter. Patient had no further questions.

## 2021-08-22 NOTE — Progress Notes (Signed)
Internal Medicine Clinic Attending ° °I saw and evaluated the patient.  I personally confirmed the key portions of the history and exam documented by Dr. Zinoviev and I reviewed pertinent patient test results.  The assessment, diagnosis, and plan were formulated together and I agree with the documentation in the resident’s note.  °

## 2021-08-23 ENCOUNTER — Other Ambulatory Visit (HOSPITAL_COMMUNITY): Payer: Self-pay

## 2021-09-17 ENCOUNTER — Other Ambulatory Visit (HOSPITAL_COMMUNITY): Payer: Self-pay

## 2021-10-24 ENCOUNTER — Other Ambulatory Visit (HOSPITAL_COMMUNITY): Payer: Self-pay

## 2021-10-24 ENCOUNTER — Other Ambulatory Visit: Payer: Self-pay | Admitting: Internal Medicine

## 2021-10-24 DIAGNOSIS — E1169 Type 2 diabetes mellitus with other specified complication: Secondary | ICD-10-CM

## 2021-10-24 DIAGNOSIS — E785 Hyperlipidemia, unspecified: Secondary | ICD-10-CM

## 2021-10-25 ENCOUNTER — Other Ambulatory Visit (HOSPITAL_COMMUNITY): Payer: Self-pay

## 2021-10-25 MED ORDER — PRAVASTATIN SODIUM 40 MG PO TABS
40.0000 mg | ORAL_TABLET | Freq: Every day | ORAL | 2 refills | Status: DC
  Filled 2021-10-25: qty 30, 30d supply, fill #0
  Filled 2021-11-28: qty 30, 30d supply, fill #1
  Filled 2021-12-17: qty 30, 30d supply, fill #2

## 2021-11-01 ENCOUNTER — Encounter: Payer: Self-pay | Admitting: Internal Medicine

## 2021-11-01 ENCOUNTER — Ambulatory Visit (INDEPENDENT_AMBULATORY_CARE_PROVIDER_SITE_OTHER): Payer: 59 | Admitting: Internal Medicine

## 2021-11-01 VITALS — BP 130/64 | HR 63 | Temp 98.4°F | Wt 145.8 lb

## 2021-11-01 DIAGNOSIS — E1169 Type 2 diabetes mellitus with other specified complication: Secondary | ICD-10-CM

## 2021-11-01 DIAGNOSIS — E785 Hyperlipidemia, unspecified: Secondary | ICD-10-CM

## 2021-11-01 DIAGNOSIS — Z23 Encounter for immunization: Secondary | ICD-10-CM

## 2021-11-01 DIAGNOSIS — Z0000 Encounter for general adult medical examination without abnormal findings: Secondary | ICD-10-CM

## 2021-11-01 DIAGNOSIS — E119 Type 2 diabetes mellitus without complications: Secondary | ICD-10-CM

## 2021-11-01 LAB — POCT GLYCOSYLATED HEMOGLOBIN (HGB A1C): Hemoglobin A1C: 8.6 % — AB (ref 4.0–5.6)

## 2021-11-01 LAB — GLUCOSE, CAPILLARY: Glucose-Capillary: 236 mg/dL — ABNORMAL HIGH (ref 70–99)

## 2021-11-01 NOTE — Progress Notes (Signed)
°  CC: routine health visit  HPI:  [name] is a 56 y.o. female with a past medical history stated below and presents today for routine health visit. Please see problem based assessment and plan for additional details.  Past Medical History:  Diagnosis Date   Diabetes mellitus without complication ([place])    Hyperlipidemia     Current Outpatient Medications on File Prior to Visit  Medication Sig Dispense Refill   metFORMIN (GLUCOPHAGE) 500 MG tablet Take 2 tablets (1,000 mg total) by mouth 2 (two) times daily with a meal. 60 tablet 4   pravastatin (PRAVACHOL) 40 MG tablet Take 1 tablet (40 mg total) by mouth daily. 30 tablet 2   No current facility-administered medications on file prior to visit.    Family History  Problem Relation Age of Onset   Cancer Mother        colon and uterine   Hypertension Sister    Migraines Sister    Diabetes Brother    Stroke Brother    Breast cancer Neg Hx     Social History: Patient is currently unemployed. Lives with her husband.  Social History   Tobacco Use   Smoking status: Never   Smokeless tobacco: Never  Vaping Use   Vaping Use: Never used  Substance Use Topics   Alcohol use: Never   Drug use: Never   Review of Systems: ROS negative except for what is noted on the assessment and plan.  Vitals:   11/01/21 0958  BP: 130/64  Pulse: 63  Temp: 98.4 F (36.9 C)  TempSrc: Oral  SpO2: 99%  Weight: 145 lb 12.8 oz (66.1 kg)     Physical Exam: General: Well appearing latin [place] female, NAD HENT: atraumatic, MMM EYES: conjunctiva non-erythematous, no scleral icterus CV: regular rate, normal rhythm, no murmurs, rubs, gallops. No lower extremity edema. Pulmonary: normal work of breathing on RA, lungs clear to auscultation, no rales, wheezes, rhonchi Abdominal: non-distended, soft, non-tender to palpation, normal BS Skin: Warm and dry, no rashes or lesions Neurological: MS: awake, alert and oriented x3, normal  speech and fund of knowledge Motor: moves all extremities antigravity Psych: normal affect    Assessment & Plan:   See Encounters Tab for problem based charting.  Patient discussed with Dr. Jilda Panda, M.D. [place] [facility], PGY-1 Pager: [phone] Date 11/01/2021 Time 10:54 AM

## 2021-11-01 NOTE — Patient Instructions (Addendum)
Carly [name]. [name] por permitirnos brindarle su atencin hoy. Hoy discutimos:  Diabetes: hoy su hemoglobina A1c fue ms alta en 8.6%, que aument de 7.9%. Contine evitando las tortillas, el arroz y los productos de pan lo mejor que pueda. Creo que es una gran idea incorporar ms frijoles y nopales en tu dieta. Aumentaremos su metformina hoy. Tomar 2 comprimidos por la maana y 1 comprimido por la noche durante 1 semana. La semana siguiente aumentar a 2 comprimidos por la maana y 2 comprimidos por la noche. Contine tomando 2 tabletas dos veces al da y lo veremos de nuevo en 3 meses para volver a Physiological scientist de [place].  Hoy recibir su vacuna contra el ttanos/Tdap. Tambin he puesto una referencia para ver a un gastroenterlogo para una colonoscopia. Primero veremos si su seguro cubrir este procedimiento.  Adjunto informacin sobre la higiene del sueo que puede resultarle til para ayudarlo a dormir por la noche.  He ordenado los siguientes laboratorios para usted:   rdenes de Designer, television/film set, capilar      POC Hbg A1C   Acceso a mi grfico: [email or web address]://[email or web address].[email or web address]?  Por favor, seguimiento en 3 meses.  Asegrese de llegar 15 minutos antes de su prxima cita. Si llega tarde, es posible que se Armed forces operational officer.  Esperamos verte la prxima vez. Llame a nuestra clnica al [phone] si tiene alguna pregunta o inquietud. El mejor horario para llamar es de lunes a viernes de 9 a. m. a 4 p. m., pero hay alguien disponible las 24 horas del da, los 7 [address]  [address] de la [place]. Si es fuera del horario de atencin o durante el fin de [place], llame al nmero principal del hospital y pregunte por el residente de guardia de medicina interna. Si necesita reposicin de medicamentos, por favor notifique a su farmacia con una semana de anticipacin y ellos nos enviarn una solicitud.  Gracias por dejarnos participar  en su cuidado. Deseandote lo mejor!  Dra. Ellison Carwin 26/10/2021, 10:54 Residente MI, PGY-1

## 2021-11-01 NOTE — Progress Notes (Signed)
Internal Medicine Clinic Attending ? ?Case discussed with Dr. Zinoviev  At the time of the visit.  We reviewed the resident?s history and exam and pertinent patient test results.  I agree with the assessment, diagnosis, and plan of care documented in the resident?s note.  ?

## 2021-11-01 NOTE — Assessment & Plan Note (Signed)
Has history of hyperlipidemia.  Is on pravastatin 40 mg daily and reports no side effects with good adherence to medication regimen.  Last lipid panel in 2020 with LDL 80.  Plan: -Continue pravastatin 40 mg daily

## 2021-11-01 NOTE — Assessment & Plan Note (Signed)
Patient will schedule eye appointment. Patient received tetanus booster today. Referral to gastroenterology for colonoscopy ordered today. Low risk for hep C. Can discuss shingles and COVID-vaccine at next appointment.

## 2021-11-01 NOTE — Assessment & Plan Note (Addendum)
Patient presents for OV for routine diabetes follow-up.  Last seen October 2022, at that time hemoglobin A1c was 7.9%.  Metformin was increased to 1000 mg twice daily and Marcelline Deist was discontinued due to side effects.  Today hemoglobin A1c 8.6%.  Patient reports has reduced her tortilla, rice, bread product intake.  Has incorporated more beans and cactus into her diet.  Reports only recently in the last few days cut out [employer] and switch to water.  Denies polydipsia, polyuria.  Reports currently only taking metformin 500 mg twice daily instead of prescribed 1000 mg twice daily.  Reports is still working on making appointment for eye exam.  On assessment, patient had worsening of hemoglobin A1c indicating blood sugars have been less well controlled in the last few months, likely secondary to not optimal metformin adherence and discontinuing [place].  We will increase metformin to optimal dose and continue with lifestyle modifications to manage her diabetes at this time.  Plan: -Metformin 1000 mg twice daily, patient clearly instructed to take 2 tablets in the morning and 1 tablet in the evening for 1 week followed by 2 tablets in the morning and 2 tablets in the evening of 500 mg dose of metformin indefinitely. -Continue lifestyle modifications -Follow-up in 3 months for hemoglobin A1c check -Patient to make eye exam appointment -If blood sugars remain elevated with elevated hemoglobin A1c, can consider addition of GLP-1 agonist

## 2021-11-28 ENCOUNTER — Other Ambulatory Visit (HOSPITAL_COMMUNITY): Payer: Self-pay

## 2021-11-28 ENCOUNTER — Telehealth: Payer: Self-pay | Admitting: *Deleted

## 2021-11-28 DIAGNOSIS — E119 Type 2 diabetes mellitus without complications: Secondary | ICD-10-CM

## 2021-11-28 NOTE — Telephone Encounter (Signed)
Needs at least 120 tablets for a month per Pharmacist.

## 2021-11-28 NOTE — Telephone Encounter (Signed)
120 tablets for which medication?  I did not personally see this patient but I am covering for Dr. Sharene Butters.

## 2021-11-30 ENCOUNTER — Other Ambulatory Visit (HOSPITAL_COMMUNITY): Payer: Self-pay

## 2021-11-30 MED ORDER — METFORMIN HCL 500 MG PO TABS
1000.0000 mg | ORAL_TABLET | Freq: Two times a day (BID) | ORAL | 5 refills | Status: DC
  Filled 2021-11-30 – 2021-12-17 (×2): qty 120, 30d supply, fill #0
  Filled 2022-01-21: qty 120, 30d supply, fill #1
  Filled 2022-02-25: qty 120, 30d supply, fill #2
  Filled 2022-03-28: qty 120, 30d supply, fill #3
  Filled 2022-04-29: qty 120, 30d supply, fill #4
  Filled 2022-05-31: qty 120, 30d supply, fill #5

## 2021-11-30 NOTE — Telephone Encounter (Signed)
I was able to change her metformin tablets to 120 tablets.

## 2021-12-17 ENCOUNTER — Other Ambulatory Visit (HOSPITAL_COMMUNITY): Payer: Self-pay

## 2021-12-19 ENCOUNTER — Other Ambulatory Visit (HOSPITAL_COMMUNITY): Payer: Self-pay

## 2022-01-21 ENCOUNTER — Other Ambulatory Visit: Payer: Self-pay | Admitting: Internal Medicine

## 2022-01-21 ENCOUNTER — Other Ambulatory Visit (HOSPITAL_COMMUNITY): Payer: Self-pay

## 2022-01-21 DIAGNOSIS — E1169 Type 2 diabetes mellitus with other specified complication: Secondary | ICD-10-CM

## 2022-01-21 MED ORDER — PRAVASTATIN SODIUM 40 MG PO TABS
40.0000 mg | ORAL_TABLET | Freq: Every day | ORAL | 2 refills | Status: DC
  Filled 2022-01-21: qty 90, 90d supply, fill #0
  Filled 2022-04-29: qty 90, 90d supply, fill #1

## 2022-01-24 IMAGING — MG DIGITAL DIAGNOSTIC BILAT W/ TOMO W/ CAD
8 series · 9 of 24 positions shown · non-contrast
Comparison: Previous exam(s).

CLINICAL DATA: 54-year-old female presenting for follow-up of a
likely benign left breast mass.

EXAM:
DIGITAL DIAGNOSTIC BILATERAL MAMMOGRAM WITH CAD AND TOMO
LEFT BREAST ULTRASOUND

[R CC synth-2D]
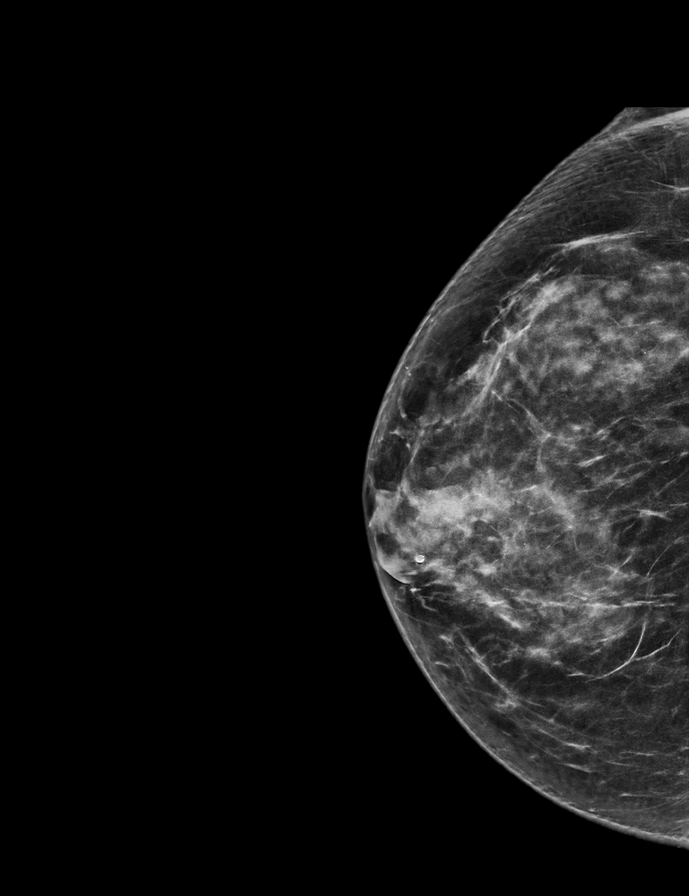

[L MLO synth-2D]
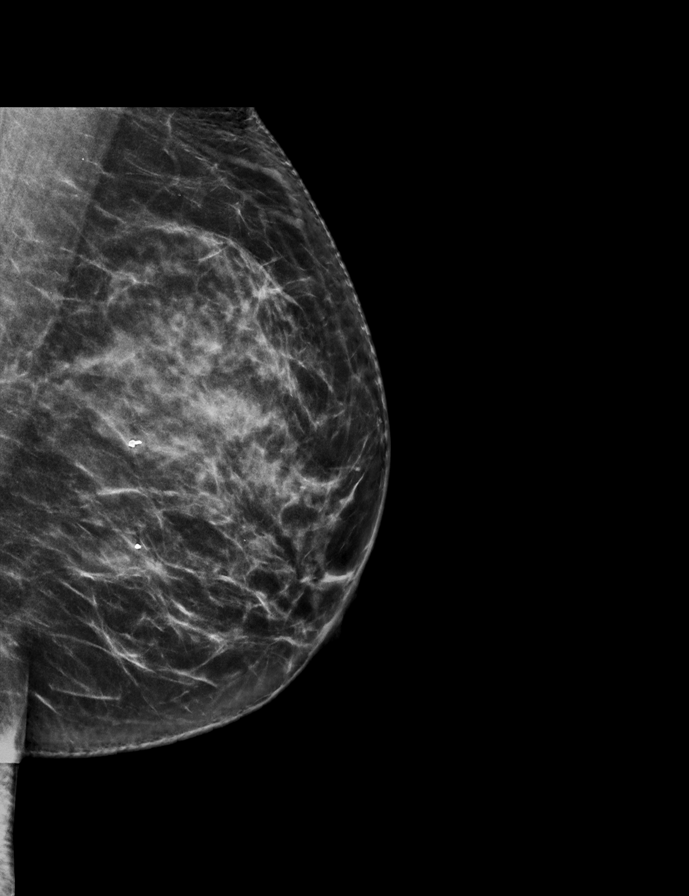

[L CC synth-2D]
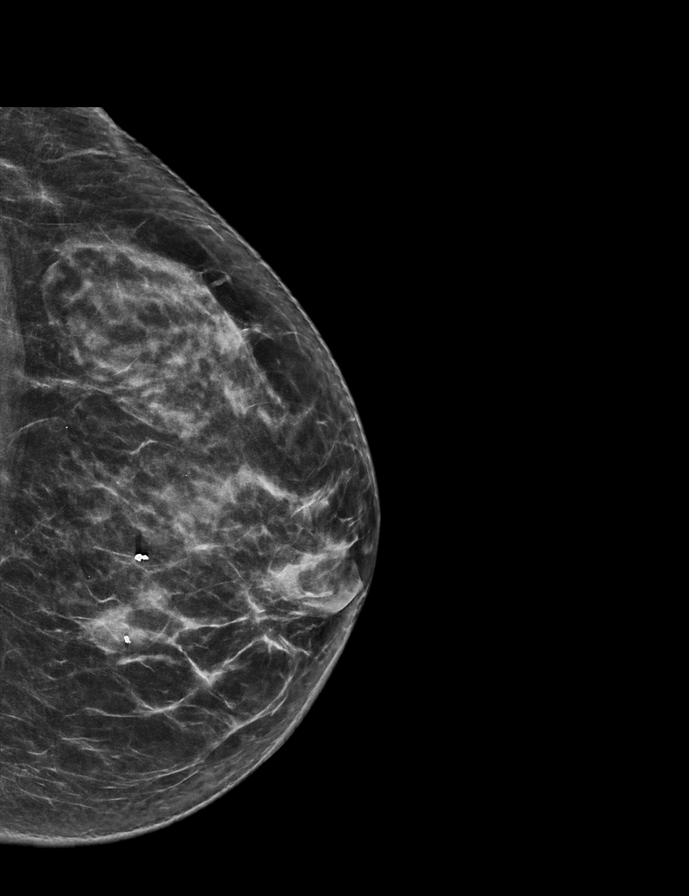

[R MLO synth-2D]
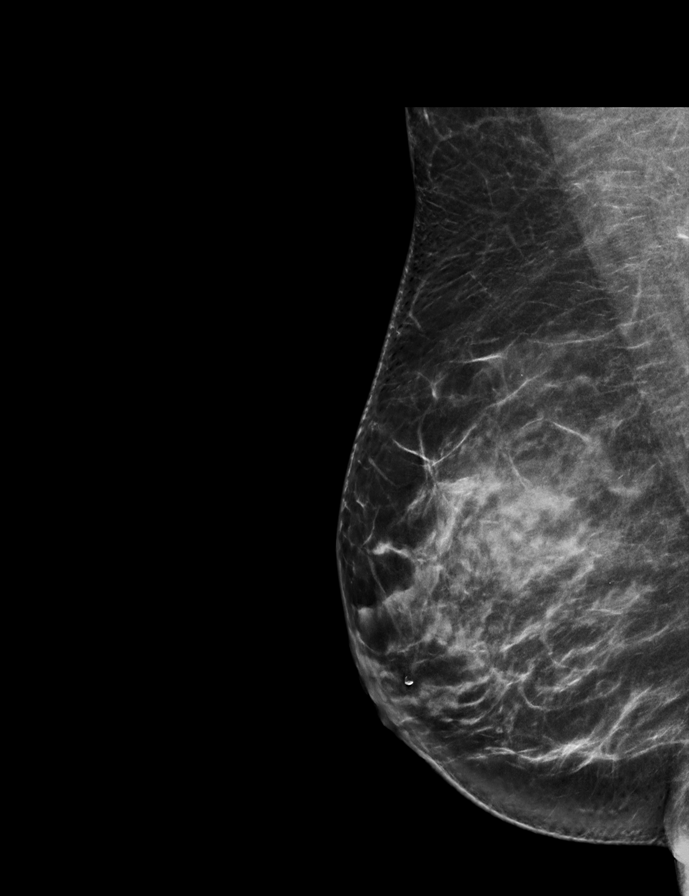

[L MLO tomo · 2 of 68 frames shown]
[frame 22/68]
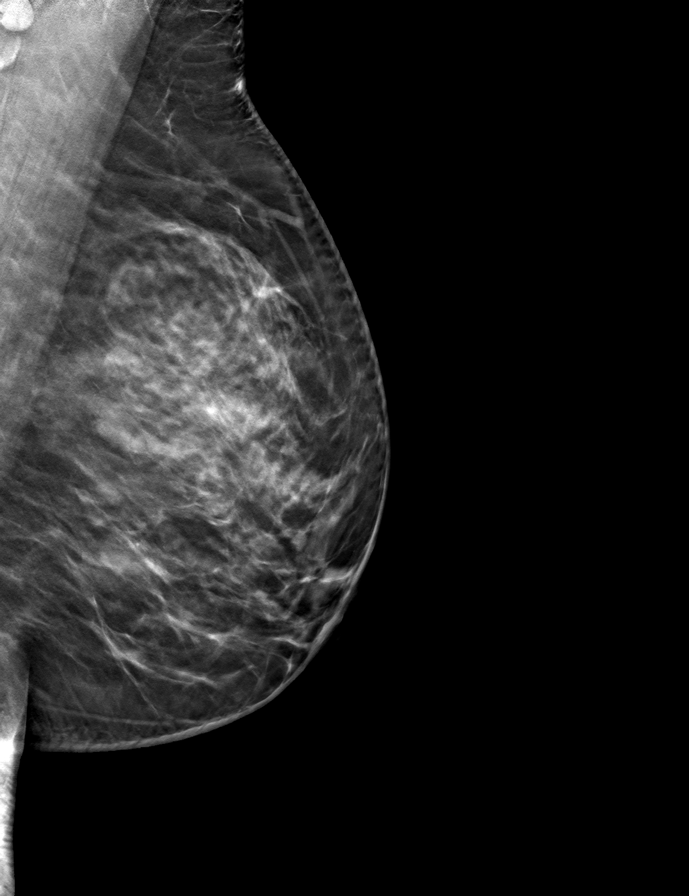
[frame 35/68]
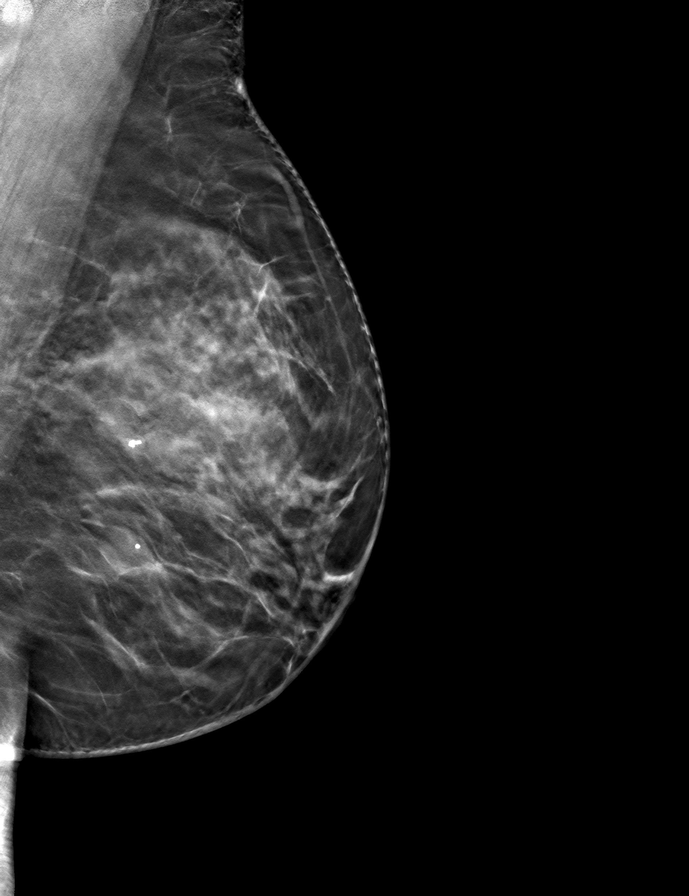

[L CC tomo · tomo slice 33/66.0]
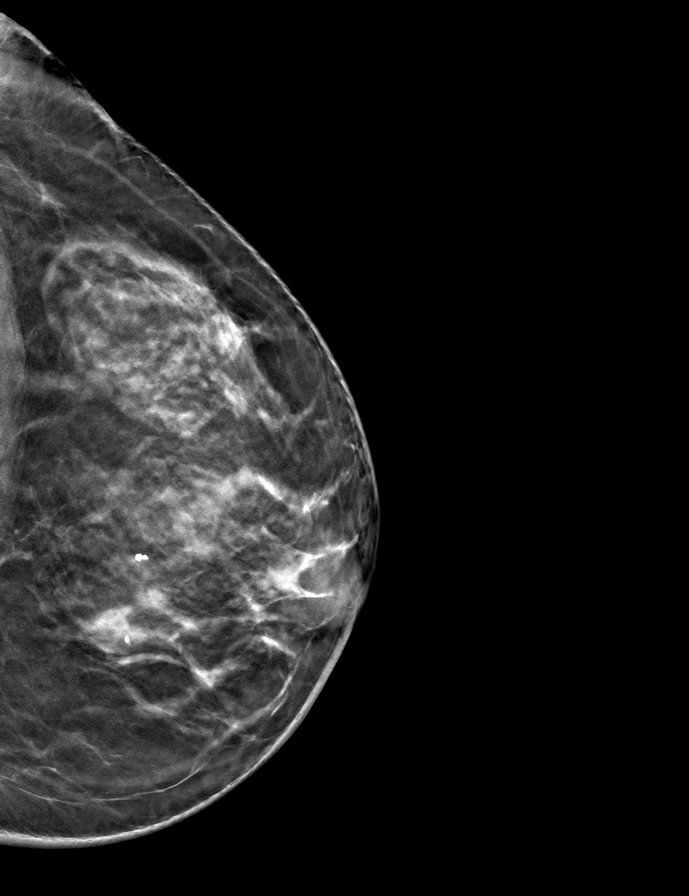

[R MLO tomo · tomo slice 38/75.0]
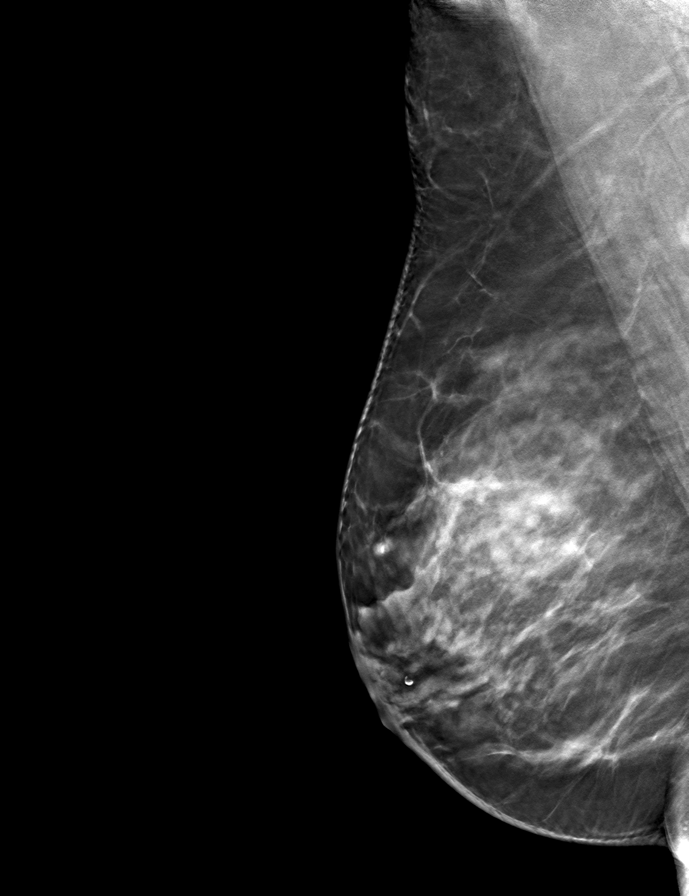

[R CC tomo · tomo slice 35/69.0]
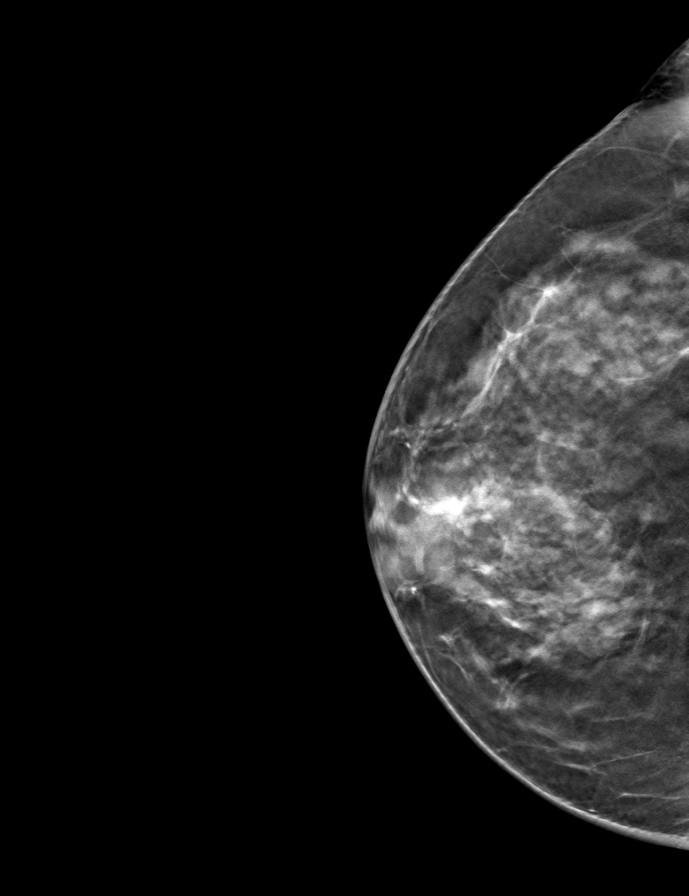

[9 of 24 positions shown; findings below may reference images not displayed]

ACR Breast Density Category c: The breast tissue is heterogeneously
dense, which may obscure small masses.
FINDINGS: The mass in the medial posterior left breast is mammographically
stable. No suspicious calcifications, masses or areas of distortion
are seen in the bilateral breasts.

Mammographic images were processed with CAD.

The mass in the left breast at 9 o'clock, 1 cm from the nipple
measures 1.3 x 0.5 x 1.0 cm, previously measuring 1.3 x 0.4 x
cm.
IMPRESSION: 1. The left breast mass at 9 o'clock has demonstrated 2 years of
stability, and is therefore benign.

2.  No mammographic evidence of malignancy in the bilateral breasts.

RECOMMENDATION:
Screening mammogram in one year.(Code:NX-E-FEW)

I have discussed the findings and recommendations with the patient.
If applicable, a reminder letter will be sent to the patient
regarding the next appointment.

BI-RADS CATEGORY  2: Benign.

## 2022-01-25 ENCOUNTER — Encounter: Payer: Self-pay | Admitting: Physician Assistant

## 2022-02-13 NOTE — Progress Notes (Signed)
? ? ? ?02/14/2022 ?Carly [name] ?[id number] ?01-06-1966 ? ?Referring provider: Ellison Carwin, MD ?Primary GI doctor: Dr. Lavon Paganini ? ?ASSESSMENT AND PLAN:  ? ?Diarrhea, unspecified type ?-     CBC with Differential/Platelet; Future ?-     Comprehensive metabolic panel; Future ?-TSH ?- most likely from increase in metformin dose 4 months ago, will check minimal labs as she has not had CBC since 2021  ?- discussed strategies to cut back on diarrhea with metformin ?- add fiber supplement ?- can get random biopsies with upcoming colonoscopy to evaluate for microscopic colitis.  ?- talk with PCP about changing meds if not better or if worsening please call so we can get stool studies.   ? ?Family history of colon cancer in mother ?We have discussed the risks of bleeding, infection, perforation, medication reactions, and remote risk of death associated with colonoscopy. All questions were answered and the patient acknowledges these risk and wishes to proceed. ? ?Type 2 diabetes mellitus without complication, without long-term current use of insulin (HCC) ?-     CBC with Differential/Platelet; Future ?-     Comprehensive metabolic panel; Future ?Last A1C elevated and sugars in 200's, long discussion with the patient about monitoring sugars prior to colonoscopy and talk with PCP.  ? ? ?Patient Care Team: ?Ellison Carwin, MD as PCP - General ? ?HISTORY OF PRESENT ILLNESS: ?56 y.o. female with a past medical history of type 2 diabetes, hyperlipidemia, overweight, family history of colon cancer in mother and others listed below presents for evaluation of diarrhea. ? ?*Due to language barrier, an interpreter was present during the history-taking and subsequent discussion (and for part of the physical exam) with this patient.  ?Patient is on metformin 1000 mg 2 times daily, started 3 years ago.  ?Will have loose stools after eating 2-3 x a week for 4 months since increase the metformin dose.  ?Otherwise she will have BM  every day or every other day.  ?No AB pain.  ?Patient denies blood in stool, fever, chills, unintentional weight loss.  ?Denies close contacts ill, recent camping, recent travel. ?Patient denies recent antibiotic use. ?Mother passed on colon cancer age 27, no other history.  ? ?Patient denies GERD.  ?Patient denies dysphagia, nausea, vomiting, melena.  ?She had negative FOBT 07/2021 ?No CBC since 2021. ?Last A1C 8.6. ? ?Current Medications:  ? ?Current Outpatient Medications (Endocrine & Metabolic):  ?  metFORMIN (GLUCOPHAGE) 500 MG tablet, Take 2 tablets (1,000 mg total) by mouth 2 (two) times daily with a meal. ? ?Current Outpatient Medications (Cardiovascular):  ?  pravastatin (PRAVACHOL) 40 MG tablet, Take 1 tablet (40 mg total) by mouth daily. ? ? ? ? ? ?Medical History:  ?Past Medical History:  ?Diagnosis Date  ? Diabetes mellitus without complication (HCC)   ? Hyperlipidemia   ? ?Allergies: No Known Allergies  ? ?Surgical History:  ?She  has a past surgical history that includes Cesarean section. ?Family History:  ?Her family history includes Cancer in her mother; Colon cancer in her mother; Diabetes in her brother; Hypertension in her sister; Migraines in her sister; Stroke in her brother. ?Social History:  ? reports that she has never smoked. She has never used smokeless tobacco. She reports that she does not drink alcohol and does not use drugs. ? ?REVIEW OF SYSTEMS  : All other systems reviewed and negative except where noted in the History of Present Illness. ? ? ?PHYSICAL EXAM: ?BP 110/63   Pulse 83   Ht 5' (  1.524 m)   Wt 144 lb (65.3 kg)   LMP 02/19/2014   SpO2 98%   BMI 28.12 kg/m?  ?General:   Pleasant, well developed female in no acute distress ?Head:   Normocephalic and atraumatic. ?Eyes:  sclerae anicteric,conjunctive pink  ?Heart:   regular rate and rhythm ?Pulm:  Clear anteriorly; no wheezing ?Abdomen:   Soft, Obese AB, Active bowel sounds. No tenderness . , No organomegaly  appreciated. ?Extremities:  Without edema. ?Msk: Symmetrical without gross deformities. Peripheral pulses intact.  ?Neurologic:  Alert and  oriented x4;  No focal deficits.  ?Skin:   Dry and intact without significant lesions or rashes. ?Psychiatric:  Cooperative. Normal mood and affect. ? ? ? ?Doree Albee, PA-C ?9:54 AM ? ? ?

## 2022-02-14 ENCOUNTER — Ambulatory Visit: Payer: 59 | Admitting: Physician Assistant

## 2022-02-14 ENCOUNTER — Encounter: Payer: Self-pay | Admitting: Physician Assistant

## 2022-02-14 ENCOUNTER — Other Ambulatory Visit (HOSPITAL_COMMUNITY): Payer: Self-pay

## 2022-02-14 ENCOUNTER — Other Ambulatory Visit (INDEPENDENT_AMBULATORY_CARE_PROVIDER_SITE_OTHER): Payer: 59

## 2022-02-14 DIAGNOSIS — R197 Diarrhea, unspecified: Secondary | ICD-10-CM

## 2022-02-14 DIAGNOSIS — Z800 Family history of malignant neoplasm of digestive organs: Secondary | ICD-10-CM | POA: Diagnosis not present

## 2022-02-14 DIAGNOSIS — E119 Type 2 diabetes mellitus without complications: Secondary | ICD-10-CM

## 2022-02-14 LAB — COMPREHENSIVE METABOLIC PANEL
ALT: 18 U/L (ref 0–35)
AST: 14 U/L (ref 0–37)
Albumin: 4.7 g/dL (ref 3.5–5.2)
Alkaline Phosphatase: 101 U/L (ref 39–117)
BUN: 13 mg/dL (ref 6–23)
CO2: 27 mEq/L (ref 19–32)
Calcium: 9.7 mg/dL (ref 8.4–10.5)
Chloride: 100 mEq/L (ref 96–112)
Creatinine, Ser: 0.69 mg/dL (ref 0.40–1.20)
GFR: 96.5 mL/min (ref >60.00)
Glucose, Bld: 151 mg/dL — ABNORMAL HIGH (ref 70–99)
Potassium: 5 mEq/L (ref 3.5–5.1)
Sodium: 136 mEq/L (ref 135–145)
Total Bilirubin: 0.6 mg/dL (ref 0.2–1.2)
Total Protein: 7.5 g/dL (ref 6.0–8.3)

## 2022-02-14 LAB — CBC WITH DIFFERENTIAL/PLATELET
Basophils Absolute: 0 10*3/uL (ref 0.0–0.1)
Basophils Relative: 0.6 % (ref 0.0–3.0)
Eosinophils Absolute: 0.1 10*3/uL (ref 0.0–0.7)
Eosinophils Relative: 1.7 % (ref 0.0–5.0)
HCT: 38.3 % (ref 36.0–46.0)
Hemoglobin: 13.4 g/dL (ref 12.0–15.0)
Lymphocytes Relative: 38.6 % (ref 12.0–46.0)
Lymphs Abs: 3.1 10*3/uL (ref 0.7–4.0)
MCHC: 35.1 g/dL (ref 30.0–36.0)
MCV: 87.3 fl (ref 78.0–100.0)
Monocytes Absolute: 0.4 10*3/uL (ref 0.1–1.0)
Monocytes Relative: 4.6 % (ref 3.0–12.0)
Neutro Abs: 4.4 10*3/uL (ref 1.4–7.7)
Neutrophils Relative %: 54.5 % (ref 43.0–77.0)
Platelets: 303 10*3/uL (ref 150.0–400.0)
RBC: 4.39 Mil/uL (ref 3.87–5.11)
RDW: 12.9 % (ref 11.5–15.5)
WBC: 8.1 10*3/uL (ref 4.0–10.5)

## 2022-02-14 LAB — TSH: TSH: 1.84 u[IU]/mL (ref 0.35–5.50)

## 2022-02-14 MED ORDER — NA SULFATE-K SULFATE-MG SULF 17.5-3.13-1.6 GM/177ML PO SOLN
1.0000 | ORAL | 0 refills | Status: DC
  Filled 2022-02-14: qty 354, 1d supply, fill #0

## 2022-02-14 NOTE — Patient Instructions (Addendum)
If you are age 57 or younger, your body mass index should be between 19-25. Your Body mass index is 28.12 kg/m?Marland Kitchen If this is out of the aformentioned range listed, please consider follow up with your Primary Care Provider.  ?________________________________________________________ ? ?The [facility] providers would like to encourage you to use [facility] to communicate with providers for non-urgent requests or questions.  Due to long hold times on the telephone, sending your provider a message by [facility] may be a faster and more efficient way to get a response.  Please allow 48 business hours for a response.  Please remember that this is for non-urgent requests.  ?_______________________________________________________ ? ?Your provider has requested that you go to the basement level for lab work before leaving today. Press "B" on the elevator. The lab is located at the first door on the left as you exit the elevator. ? ?You have been scheduled for a colonoscopy. Please follow written instructions given to you at your visit today.  ?Please pick up your prep supplies at the pharmacy within the next 1-3 days. ?If you use inhalers (even only as needed), please bring them with you on the day of your procedure. ? ?Due to recent changes in healthcare laws, you may see the results of your imaging and laboratory studies on MyChart before your provider has had a chance to review them.  We understand that in some cases there may be results that are confusing or concerning to you. Not all laboratory results come back in the same time frame and the provider may be waiting for multiple results in order to interpret others.  Please give [place] 48 hours in order for your provider to thoroughly review all the results before contacting the office for clarification of your results.  ? ?Watch your sugars prior to the colonoscopy ? ?Toma metformin un pas por la manana con comida, y un almuerza y [place] con cena ?Si no teayuda con diarrhea, habla  con tu doctor [employer].  ? ?Thank you for entrusting me with your care and choosing [facility]. ? ?Quentin Mulling, PA-C ? ?Si tiene 64 a?os o menos, su ?ndice de masa corporal debe estar entre 19 y 27. Su ?ndice de masa corporal es de 28,12 kg/m?Marland Kitchen Si esto est? fuera del rango mencionado anteriormente, considere hacer un seguimiento con su proveedor de atenci?n primaria. ?________________________________________________________ ? ?Los proveedores de Adult nurse GI desean alentarlo a que use MYCHART para comunicarse con los proveedores para solicitudes o preguntas que no sean urgentes. Debido a los Retail buyer de espera en el tel?fono, enviar un mensaje a su proveedor por [employer] puede ser una forma m?s r?pida y eficiente de Designer, industrial/product. Espere 48 horas h?biles para obtener [employer]. Recuerde que esto es para solicitudes no urgentes. ?_______________________________________________________ ? ?Su proveedor le ha pedido que vaya al nivel del s?tano para el trabajo de laboratorio antes de salir hoy. Presione "B" en el ascensor. El laboratorio est? ubicado en la primera puerta a la izquierda al salir del Medical sales representative. ? ?Le han programado una colonoscopia. Siga las instrucciones escritas que se le dieron en su visita de hoy. ?Recoja sus suministros de preparaci?n en la farmacia dentro de los pr?ximos 1 a 3 d?as. ?Si [place] inhaladores (aunque solo sea necesario), tr?igalos el d?a de su procedimiento. ? ?Debido a cambios recientes en las leyes de atenci?n m?dica, es posible que vea los resultados de sus estudios de im?genes y de laboratorio en MyChart antes de que su  proveedor haya tenido la oportunidad de revisarlos. Entendemos que en algunos casos puede haber resultados confusos o preocupantes para usted. No todos los resultados de laboratorio regresan en el mismo per?odo de tiempo y el proveedor puede estar esperando m?ltiples resultados para Administrator, Civil Service. Denos 48 horas para que su  proveedor revise minuciosamente todos los resultados antes de comunicarse con la oficina para Engineer, structural. ? ?Cuida tus az?cares antes de la colonoscopia ? ?Gracias, ? ?Quentin Mulling, PA-C ? ? ? ?

## 2022-02-15 ENCOUNTER — Other Ambulatory Visit (HOSPITAL_COMMUNITY): Payer: Self-pay

## 2022-02-25 ENCOUNTER — Other Ambulatory Visit (HOSPITAL_COMMUNITY): Payer: Self-pay

## 2022-02-28 NOTE — Progress Notes (Signed)
Reviewed and agree with documentation and assessment and plan. K. Veena Jager Koska , MD   

## 2022-03-06 ENCOUNTER — Encounter: Payer: 59 | Admitting: Internal Medicine

## 2022-03-07 NOTE — Progress Notes (Unsigned)
   CC: diabetes follow up  HPI:  [name] is a 56 y.o. with medical history as below presenting to [facility] for diabetes follow.  Please see problem-based list for further details, assessments, and plans.  Past Medical History:  Diagnosis Date   Diabetes mellitus without complication (HCC)    Hyperlipidemia    Review of Systems:  Review of system negative unless stated in the problem list or HPI.    Physical Exam:  Vitals:   03/08/22 1011  BP: 128/68  Pulse: 61  Temp: 98.6 F (37 C)  TempSrc: Oral  SpO2: 99%  Weight: 144 lb 1.6 oz (65.4 kg)    Physical Exam General: Well-developed, NAD Head: Normocephalic without scalp lesions.  HENT: NCAT, PERRL Lungs: CTAB, no wheeze, rhonchi or rales.  Cardiovascular: Normal heart sounds, no r/m/g, 2+ pulses in all extremities. No LE edema Abdomen: No TTP, normal bowel sounds MSK: No asymmetry or muscle atrophy. Full range of motion (ROM) of all joints.  Skin: warm, dry good skin turgor, no lesions noted Neuro: Alert and oriented. CN grossly intact Psych: Normal mood and normal affect   Assessment & Plan:   See Encounters Tab for problem based charting.  Patient discussed with Dr. Rubie Maid, MD  PMHx of DMII, HLD  DMII Assessment/Plan A1c 8.6 10/2021, A1c 7.1 today. CBG 186 today.  Improving on current regimen. Reports good medication compliance. Tolerating medication without adverse effects. Counseled on the benefits of daily exercise, limiting processed foods and high sugar foods, and weight loss. No hypoglycemic episodes. Denies polydipsia, polyuria, weight loss, lethargy, blurry vision, and changes in sensation. No wounds or ulcer of bilateral feet. Benign physical exam and vitals wnl.   Continue Metformin 1000 mg BID Continue lifestyle modifications  Preventive Exam: Eye exam-Will make an appointment Colonoscopy-July 18

## 2022-03-08 ENCOUNTER — Ambulatory Visit (INDEPENDENT_AMBULATORY_CARE_PROVIDER_SITE_OTHER): Payer: 59 | Admitting: Internal Medicine

## 2022-03-08 ENCOUNTER — Other Ambulatory Visit (HOSPITAL_COMMUNITY): Payer: Self-pay

## 2022-03-08 VITALS — BP 128/68 | HR 61 | Temp 98.6°F | Wt 144.1 lb

## 2022-03-08 DIAGNOSIS — Z7984 Long term (current) use of oral hypoglycemic drugs: Secondary | ICD-10-CM

## 2022-03-08 DIAGNOSIS — Z0000 Encounter for general adult medical examination without abnormal findings: Secondary | ICD-10-CM

## 2022-03-08 DIAGNOSIS — E1169 Type 2 diabetes mellitus with other specified complication: Secondary | ICD-10-CM

## 2022-03-08 DIAGNOSIS — Z1231 Encounter for screening mammogram for malignant neoplasm of breast: Secondary | ICD-10-CM

## 2022-03-08 LAB — GLUCOSE, CAPILLARY: Glucose-Capillary: 186 mg/dL — ABNORMAL HIGH (ref 70–99)

## 2022-03-08 LAB — POCT GLYCOSYLATED HEMOGLOBIN (HGB A1C): Hemoglobin A1C: 7.1 % — AB (ref 4.0–5.6)

## 2022-03-08 MED ORDER — TIRZEPATIDE 2.5 MG/0.5ML ~~LOC~~ SOAJ
SUBCUTANEOUS | 3 refills | Status: DC
  Filled 2022-03-08: qty 2, 28d supply, fill #0

## 2022-03-08 NOTE — Patient Instructions (Addendum)
Carly [name], it was a pleasure seeing you today! You endorsed feeling well today. Below are some of the things we talked about this visit. We look forward to seeing you in the follow up appointment!  Today we discussed: You presented today for your diabetes.  Your A1c was 7.1 which is significant improvement from your previous 8.6.  I will congratulate you on doing a great job getting your A1c down!  To help bring it down more and help with your weight loss I discussed with you starting that as an injection.  It is called a GLP-1 agonist and can help with both diabetes and weight loss.  You stated you would like to try that medicine if your insurance covers it.  We will work on getting her started on that medication.  Please continue taking your metformin and your pravastatin and follow-up in 3 months.  I will work on ordering a mammogram for you.  I have ordered the following labs today:   Lab Orders         Glucose, capillary         POC Hbg A1C       Referrals ordered today:   Referral Orders  No referral(s) requested today     I have ordered the following medication/changed the following medications:   Stop the following medications: There are no discontinued medications.   Start the following medications: No orders of the defined types were placed in this encounter.    Follow-up: 3 month follow up  Please make sure to arrive 15 minutes prior to your next appointment. If you arrive late, you may be asked to reschedule.   We look forward to seeing you next time. Please call our clinic at [phone] if you have any questions or concerns. The best time to call is Monday-Friday from 9am-4pm, but there is someone available 24/7. If after hours or the weekend, call the main hospital number and ask for the Internal Medicine Resident On-Call. If you need medication refills, please notify your pharmacy one week in advance and they will send Carly [name] a request.  Thank you for  letting Carly [name] take part in your care. Wishing you the best!  Thank you, Gwenevere Abbot, MD   [name], fue un placer verla hoy! Aprobaste sentirte bien hoy. A continuacin se presentan algunas de las cosas que hablamos sobre esta visita. Esperamos verlo en la cita de seguimiento!  Hoy discutimos: Te presentaste hoy por tu diabetes. Su A1c fue 7.1, lo cual es una mejora significativa con respecto a su anterior 8.6. Lo felicitar por hacer un gran trabajo al bajar su A1c! Para ayudar a reducirlo ms y ayudarlo con su prdida de peso, habl con usted sobre cmo comenzar con una inyeccin. Se llama agonista de GLP-1 y puede ayudar tanto con la diabetes como con la prdida de Carly [name]. Dijo que le gustara probar ese medicamento si su seguro lo cubre. Trabajaremos para que comience a Doctor, hospital. Contine tomando su metformina y su pravastatina y haga un seguimiento en 3 meses. Trabajar para ordenar una mamografa para usted.  He ordenado los siguientes laboratorios hoy:   rdenes de laboratorio      Glucosa, capilar      POC Hbg A1C   Referencias ordenadas hoy:  rdenes de referencia No se solicitaron referencias hoy    He ordenado el siguiente medicamento/cambiado los siguientes medicamentos:  Suspender los siguientes medicamentos: No hay medicamentos descontinuados.  Iniciar los siguientes medicamentos: No  se realizaron pedidos de los tipos definidos en este encuentro.    Seguimiento: 3 meses de Catering manager de llegar 15 minutos antes de su prxima cita. Si llega tarde, es posible que se Armed forces operational officer.  Esperamos verte la prxima vez. Llame a nuestra clnica al [phone] si tiene alguna pregunta o inquietud. El mejor horario para llamar es de lunes a viernes de 9 a. m. a 4 p. m., pero hay alguien disponible las 24 horas del da, los 7 [address]  [address] de [place]. Si es fuera del horario de atencin o durante el fin de [place], llame al nmero  principal del hospital y pregunte por el residente de guardia de medicina interna. Si necesita reposicin de medicamentos, por favor notifique a su farmacia con una semana de anticipacin y ellos nos enviarn una solicitud.  Gracias por dejarnos participar en su cuidado. Deseandote lo mejor!  Gelene Mink, MD

## 2022-03-09 MED ORDER — TIRZEPATIDE 2.5 MG/0.5ML ~~LOC~~ SOAJ
2.5000 mg | SUBCUTANEOUS | 0 refills | Status: AC
  Filled 2022-03-09 – 2022-03-12 (×2): qty 2, 28d supply, fill #0

## 2022-03-09 MED ORDER — TIRZEPATIDE 5 MG/0.5ML ~~LOC~~ SOAJ
5.0000 mg | SUBCUTANEOUS | 0 refills | Status: AC
  Filled 2022-03-09 – 2022-03-12 (×2): qty 4, 56d supply, fill #0

## 2022-03-09 NOTE — Assessment & Plan Note (Signed)
Uncontrolled. A1c 7.1 today. Significantly improved from 8.6. CBG 186 today but patient states she had food left over from a gathering last night. She has made changes to her diet. Overall improving on current regimen. Reports good medication compliance. Tolerating medication without adverse effects. Counseled on the benefits of daily exercise, limiting processed foods and high sugar foods, and weight loss. No hypoglycemic episodes. Denies polydipsia, polyuria, weight loss, lethargy, blurry vision, and changes in sensation. Discussed at length regarding starting a GLP 1 agonist, and patient agreeable.  -Continue Metformin 1000 mg BID, start GLP 1 agonist, depends on insurance. Will prescribe mounjaro and have patient start at lowest dose and titrate one dose up after 1 month before 3 month follow up. -Continue lifestyle modifications

## 2022-03-09 NOTE — Assessment & Plan Note (Signed)
Preventive Medicine: Eye exam-Will make an appointment Colonoscopy-Coming up on July 18 Mammogram: Referral placed to have done at facility that takes patient's insurance.

## 2022-03-11 ENCOUNTER — Other Ambulatory Visit (HOSPITAL_COMMUNITY): Payer: Self-pay

## 2022-03-11 NOTE — Progress Notes (Signed)
Internal Medicine Clinic Attending  Case discussed with Dr. Khan  At the time of the visit.  We reviewed the resident's history and exam and pertinent patient test results.  I agree with the assessment, diagnosis, and plan of care documented in the resident's note.  

## 2022-03-12 ENCOUNTER — Telehealth: Payer: Self-pay

## 2022-03-12 ENCOUNTER — Other Ambulatory Visit (HOSPITAL_COMMUNITY): Payer: Self-pay

## 2022-03-12 NOTE — Telephone Encounter (Signed)
PA  for pt [facility] ) came through on cover my meds when I used the key that was sent from the pharmacy and completed the questions a notice from plan came through  as follows :   [id number] 12/2021 [employer] [place], [facility] 2023. All rights reserved.  ? There was an error with your request  ? The [employer] Prior Authorization Department is unable to review this request at this time. Our records indicate that this is not one of [employer]'s members. Please review the member's insurance demographics and submit to the appropriate party. Thank you in advance.  ? Prescriber Instructions This is a Dealer form (ePA). Complete the fields below, then click the "Send to Plan" button to submit. ? Patient Information Did you mean: [name]? Clear Name First Carly Hesselbach Middle N/A Last [name] Date of Birth 1966/08/16 Gender  Female  Female  Unspecified Member ID [id number] Address [address] (optional) [place] Zip [address] Phone       ( COPY SENT BACK TO PHARMACY ALONG WITH [name] KEY )

## 2022-03-28 ENCOUNTER — Other Ambulatory Visit (HOSPITAL_COMMUNITY): Payer: Self-pay

## 2022-03-31 ENCOUNTER — Encounter: Payer: Self-pay | Admitting: *Deleted

## 2022-04-23 ENCOUNTER — Encounter: Payer: Self-pay | Admitting: Gastroenterology

## 2022-04-23 ENCOUNTER — Ambulatory Visit (AMBULATORY_SURGERY_CENTER): Payer: 59 | Admitting: Gastroenterology

## 2022-04-23 VITALS — BP 124/61 | HR 48 | Temp 98.0°F | Resp 13 | Ht 60.0 in | Wt 144.0 lb

## 2022-04-23 DIAGNOSIS — K648 Other hemorrhoids: Secondary | ICD-10-CM

## 2022-04-23 DIAGNOSIS — R197 Diarrhea, unspecified: Secondary | ICD-10-CM

## 2022-04-23 DIAGNOSIS — Z800 Family history of malignant neoplasm of digestive organs: Secondary | ICD-10-CM | POA: Diagnosis not present

## 2022-04-23 MED ORDER — SODIUM CHLORIDE 0.9 % IV SOLN: 500.0000 mL | Freq: Once | INTRAVENOUS | Status: DC

## 2022-04-23 NOTE — Op Note (Signed)
[facility] Patient Name: Carly [name] Procedure Date: 04/23/2022 11:06 AM MRN: [id number] Endoscopist: Napoleon Form , MD Age: 56 Referring MD:  Date of Birth: Jan 22, 1966 Gender: Female Account #: [id number] Procedure:                Colonoscopy Indications:              Screening in patient at increased risk: Colorectal                            cancer in mother before age 59, Incidental diarrhea                            noted Medicines:                Monitored Anesthesia Care Procedure:                Pre-Anesthesia Assessment:                           - Prior to the procedure, a History and Physical                            was performed, and patient medications and                            allergies were reviewed. The patient's tolerance of                            previous anesthesia was also reviewed. The risks                            and benefits of the procedure and the sedation                            options and risks were discussed with the patient.                            All questions were answered, and informed consent                            was obtained. Prior Anticoagulants: The patient has                            taken no previous anticoagulant or antiplatelet                            agents. ASA Grade Assessment: II - A patient with                            mild systemic disease. After reviewing the risks                            and benefits, the patient was deemed in  satisfactory condition to undergo the procedure.                           After obtaining informed consent, the colonoscope                            was passed under direct vision. Throughout the                            procedure, the patient's blood pressure, pulse, and                            oxygen saturations were monitored continuously. The                            PCF-HQ190L Colonoscope was introduced  through the                            anus and advanced to the the cecum, identified by                            appendiceal orifice and ileocecal valve. The                            colonoscopy was performed without difficulty. The                            patient tolerated the procedure well. The quality                            of the bowel preparation was excellent. The                            terminal ileum, ileocecal valve, appendiceal                            orifice, and rectum were photographed. Scope In: 11:17:28 AM Scope Out: 11:28:50 AM Scope Withdrawal Time: 0 hours 8 minutes 53 seconds  Total Procedure Duration: 0 hours 11 minutes 22 seconds  Findings:                 The perianal and digital rectal examinations were                            normal.                           Normal mucosa was found in the entire colon.                            Biopsies for histology were taken with a cold                            forceps from the entire colon for evaluation of  microscopic colitis.                           Non-bleeding external and internal hemorrhoids were                            found during retroflexion. The hemorrhoids were                            medium-sized.                           The exam was otherwise without abnormality. Complications:            No immediate complications. Estimated Blood Loss:     Estimated blood loss was minimal. Impression:               - Normal mucosa in the entire examined colon.                            Biopsied.                           - Non-bleeding external and internal hemorrhoids.                           - The examination was otherwise normal. Recommendation:           - Patient has a contact number available for                            emergencies. The signs and symptoms of potential                            delayed complications were discussed with the                             patient. Return to normal activities tomorrow.                            Written discharge instructions were provided to the                            patient.                           - Resume previous diet.                           - Continue present medications.                           - Await pathology results.                           - Repeat colonoscopy in 5 years for surveillance  based on pathology results. Napoleon Form, MD 04/23/2022 11:32:24 AM This report has been signed electronically.

## 2022-04-23 NOTE — Progress Notes (Signed)
Called to room to assist during endoscopic procedure.  Patient ID and intended procedure confirmed with present staff. Received instructions for my participation in the procedure from the performing physician.  

## 2022-04-23 NOTE — Progress Notes (Signed)
[facility] History and Physical   Primary Care Physician:  Olegario Messier, MD   Reason for Procedure:  Family history of colon cancer  Plan:    Screening colonoscopy with possible interventions as needed     HPI: Carly [name] is a very pleasant 56 y.o. female here for screening colonoscopy due to family h/o colon cancer. 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 Plan to obtain biopsies to exclude microscopic colitis.  The risks and benefits as well as alternatives of endoscopic procedure(s) have been discussed and reviewed. All questions answered. The patient agrees to proceed.    Past Medical History:  Diagnosis Date   Diabetes mellitus without complication (HCC)    Hyperlipidemia     Past Surgical History:  Procedure Laterality Date   CESAREAN SECTION      Prior to Admission medications   Medication Sig Start Date End Date Taking? Authorizing Provider  metFORMIN (GLUCOPHAGE) 500 MG tablet Take 2 tablets (1,000 mg total) by mouth 2 (two) times daily with a meal. 11/30/21  Yes Amponsah, Flossie Buffy, MD  pravastatin (PRAVACHOL) 40 MG tablet Take 1 tablet (40 mg total) by mouth daily. 01/21/22  Yes Ellison Carwin, MD  tirzepatide [facility]) 5 MG/0.5ML Pen Inject 5 mg into the skin once a week. Start this after finishing up 28 days of 2.5 mg weekly of tirzepatide. 03/09/22 05/04/22  Gwenevere Abbot, MD    Current Outpatient Medications  Medication Sig Dispense Refill   metFORMIN (GLUCOPHAGE) 500 MG tablet Take 2 tablets (1,000 mg total) by mouth 2 (two) times daily with a meal. 120 tablet 5   pravastatin (PRAVACHOL) 40 MG tablet Take 1 tablet (40 mg total) by mouth daily. 90 tablet 2   tirzepatide (MOUNJARO) 5 MG/0.5ML Pen Inject 5 mg into the skin once a week. Start this after finishing up 28 days of 2.5 mg weekly of tirzepatide. 4 mL 0   Current Facility-Administered Medications  Medication Dose Route Frequency Provider Last Rate Last Admin    0.9 %  sodium chloride infusion  500 mL Intravenous Once Napoleon Form, MD        Allergies as of 04/23/2022   (No Known Allergies)    Family History  Problem Relation Age of Onset   Colon cancer Mother    Cancer Mother        colon and uterine   Hypertension Sister    Migraines Sister    Diabetes Brother    Stroke Brother    Breast cancer Neg Hx     Social History   Socioeconomic History   Marital status: Married    Spouse name: Not on file   Number of children: 1   Years of education: Not on file   Highest education level: 6th grade  Occupational History   Not on file  Tobacco Use   Smoking status: Never   Smokeless tobacco: Never  Vaping Use   Vaping Use: Never used  Substance and Sexual Activity   Alcohol use: Never   Drug use: Never   Sexual activity: Yes    Birth control/protection: None, Post-menopausal  Other Topics Concern   Not on file  Social History Narrative   Not on file   Social Determinants of Health   Financial Resource Strain: Not on file  Food Insecurity: Not on file  Transportation Needs: No Transportation Needs (02/08/2020)   PRAPARE - Administrator, Civil Service (Medical): No    Lack of Transportation (Non-Medical): No  Physical Activity: Sufficiently Active (01/15/2018)   Exercise Vital Sign    Days of Exercise per Week: 5 days    Minutes of Exercise per Session: 60 min  Stress: No Stress Concern Present (01/15/2018)   [employer] of Occupational Health - Occupational Stress Questionnaire    Feeling of Stress : Not at all  Social Connections: Socially Integrated (01/15/2018)   Social Connection and Isolation Panel [NHANES]    Frequency of Communication with Friends and Family: More than three times a week    Frequency of Social Gatherings with Friends and Family: Once a week    Attends Religious Services: More than 4 times per year    Active Member of [employer] or Organizations: Yes    Attends Tax inspector Meetings: 1 to 4 times per year    Marital Status: Married  Catering manager Violence: Unknown (01/15/2018)   Humiliation, Afraid, Rape, and Kick questionnaire    Fear of Current or Ex-Partner: Patient refused    Emotionally Abused: No    Physically Abused: No    Sexually Abused: No    Review of Systems:  All other review of systems negative except as mentioned in the HPI.  Physical Exam: Vital signs in last 24 hours: BP (!) 141/79   Pulse (!) 58   Temp 98 F (36.7 C)   Ht 5' (1.524 m)   Wt 144 lb (65.3 kg)   LMP 02/19/2014   SpO2 100%   BMI 28.12 kg/m  General:   Alert, NAD Lungs:  Clear .   Heart:  Regular rate and rhythm Abdomen:  Soft, nontender and nondistended. Neuro/Psych:  Alert and cooperative. Normal mood and affect. A and O x 3  Reviewed labs, radiology imaging, old records and pertinent past GI work up  Patient is appropriate for planned procedure(s) and anesthesia in an ambulatory setting   K. Scherry Ran , MD [phone]

## 2022-04-23 NOTE — Progress Notes (Signed)
To pacu, VSS. Report to [email or web address] 

## 2022-04-23 NOTE — Patient Instructions (Addendum)
**  Handout given on Hemorrhoid**   USTED TUVO UN PROCEDIMIENTO ENDOSCPICO HOY EN EL [facility]:   Lea el informe del procedimiento que se le entreg para cualquier pregunta especfica sobre lo que se Dentist.  Si el informe del examen no responde a sus preguntas, por favor llame a su gastroenterlogo para aclararlo.  Si usted solicit que no se le den [employer] de lo que se Clinical cytogeneticist en su procedimiento al [employer] va a cuidar, entonces el informe del procedimiento se ha incluido en un sobre sellado para que usted lo revise despus cuando le sea ms conveniente.   LO QUE PUEDE ESPERAR: Algunas sensaciones de hinchazn en el abdomen.  Puede tener ms gases de lo normal.  El caminar puede ayudarle a eliminar el aire que se le puso en el tracto gastrointestinal durante el procedimiento y reducir la hinchazn.  Si le hicieron una endoscopia inferior (como una colonoscopia o una sigmoidoscopia flexible), podra notar manchas de sangre en las heces fecales o en el papel higinico.  Si se someti a una preparacin intestinal para su procedimiento, es posible que no tenga una evacuacin intestinal normal durante [employer].   Tenga en cuenta:  Es posible que note un poco de irritacin y congestin en la nariz o algn drenaje.  Esto es debido al oxgeno [employer] durante su procedimiento.  No hay que preocuparse y esto debe desaparecer ms o Regulatory affairs officer.   SNTOMAS PARA REPORTAR INMEDIATAMENTE:  Despus de una endoscopia inferior (colonoscopia o sigmoidoscopia flexible):  Cantidades excesivas de sangre en las heces fecales  Sensibilidad significativa o empeoramiento de los dolores abdominales   Hinchazn aguda del abdomen que antes no tena   Fiebre de 100F o ms     Para asuntos urgentes o de Associate Professor, puede comunicarse con un gastroenterlogo a cualquier hora llamando al [phone].  DIETA:  Recomendamos una comida pequea al principio, pero luego  puede continuar con su dieta normal.  Tome muchos lquidos, Tax adviser las bebidas alcohlicas durante 24 horas.    ACTIVIDAD:  Debe planear tomarse las cosas con calma por el resto del da y no debe CONDUCIR ni usar maquinaria pesada Patent examiner (debido a los medicamentos de sedacin utilizados durante el examen).     SEGUIMIENTO: Nuestro personal llamar al nmero que aparece en su historial al siguiente da hbil de su procedimiento para ver cmo se siente y para responder cualquier pregunta o inquietud que pueda tener con respecto a la informacin que se le dio despus del procedimiento. Si no podemos contactarle, le dejaremos un mensaje.  Sin embargo, si se siente bien y no tiene English as a second language teacher, no es necesario que nos devuelva la llamada.  Asumiremos que ha regresado a sus actividades diarias normales sin incidentes. Si se le tomaron algunas biopsias, le contactaremos por telfono o por carta en las prximas 3 semanas.  Si no ha sabido [employer] biopsias en el transcurso de 3 semanas, por favor llmenos al [phone].   FIRMAS/CONFIDENCIALIDAD: Usted y/o el acompaante que le cuide han firmado documentos que se ingresarn en su historial mdico electrnico.  Estas firmas atestiguan el hecho de que la informacin anterior

## 2022-04-24 ENCOUNTER — Telehealth: Payer: Self-pay | Admitting: *Deleted

## 2022-04-24 NOTE — Telephone Encounter (Signed)
  Follow up Call-     04/23/2022   10:19 AM  Call back number  Post procedure Call Back phone  # [phone] (speaks mostly spanish, very little english.  Understands "are you doing ok?")  Permission to leave phone message Yes     Patient questions:  Do you have a fever, pain , or abdominal swelling? No. Pain Score  0 *  Have you tolerated food without any problems? Yes.    Have you been able to return to your normal activities? Yes.    Do you have any questions about your discharge instructions: Diet   No. Medications  No. Follow up visit  No.  Do you have questions or concerns about your Care? No.  Actions: * If pain score is 4 or above: No action needed, pain <4.

## 2022-04-29 ENCOUNTER — Other Ambulatory Visit (HOSPITAL_COMMUNITY): Payer: Self-pay

## 2022-05-13 ENCOUNTER — Encounter: Payer: Self-pay | Admitting: Gastroenterology

## 2022-05-31 ENCOUNTER — Other Ambulatory Visit (HOSPITAL_COMMUNITY): Payer: Self-pay

## 2022-07-03 ENCOUNTER — Other Ambulatory Visit: Payer: Self-pay | Admitting: Student

## 2022-07-03 ENCOUNTER — Other Ambulatory Visit (HOSPITAL_COMMUNITY): Payer: Self-pay

## 2022-07-03 DIAGNOSIS — E119 Type 2 diabetes mellitus without complications: Secondary | ICD-10-CM

## 2022-07-04 ENCOUNTER — Other Ambulatory Visit: Payer: Self-pay

## 2022-07-04 ENCOUNTER — Other Ambulatory Visit (HOSPITAL_COMMUNITY): Payer: Self-pay

## 2022-07-04 DIAGNOSIS — E119 Type 2 diabetes mellitus without complications: Secondary | ICD-10-CM

## 2022-07-04 DIAGNOSIS — E1169 Type 2 diabetes mellitus with other specified complication: Secondary | ICD-10-CM

## 2022-07-05 ENCOUNTER — Other Ambulatory Visit (HOSPITAL_COMMUNITY): Payer: Self-pay

## 2022-07-05 MED ORDER — PRAVASTATIN SODIUM 40 MG PO TABS
40.0000 mg | ORAL_TABLET | Freq: Every day | ORAL | 2 refills | Status: DC
  Filled 2022-07-05: qty 30, 30d supply, fill #0
  Filled 2022-08-02: qty 90, 90d supply, fill #0

## 2022-07-05 MED ORDER — METFORMIN HCL 500 MG PO TABS
1000.0000 mg | ORAL_TABLET | Freq: Two times a day (BID) | ORAL | 5 refills | Status: DC
  Filled 2022-07-05: qty 120, 30d supply, fill #0
  Filled 2022-08-02: qty 120, 30d supply, fill #1
  Filled 2022-12-17: qty 120, 30d supply, fill #2
  Filled 2023-01-16: qty 120, 30d supply, fill #3
  Filled 2023-02-17: qty 120, 30d supply, fill #4
  Filled 2023-03-21: qty 120, 30d supply, fill #5

## 2022-07-11 ENCOUNTER — Ambulatory Visit
Admission: RE | Admit: 2022-07-11 | Discharge: 2022-07-11 | Disposition: A | Discharge status: Home / Self Care | Payer: Commercial Managed Care - HMO | Source: Ambulatory Visit | Attending: Internal Medicine | Admitting: Internal Medicine

## 2022-07-19 ENCOUNTER — Ambulatory Visit (INDEPENDENT_AMBULATORY_CARE_PROVIDER_SITE_OTHER): Payer: Commercial Managed Care - HMO | Admitting: Student

## 2022-07-19 ENCOUNTER — Other Ambulatory Visit (HOSPITAL_COMMUNITY): Payer: Self-pay

## 2022-07-19 VITALS — BP 146/71 | HR 61 | Temp 97.6°F | Wt 144.9 lb

## 2022-07-19 DIAGNOSIS — Z124 Encounter for screening for malignant neoplasm of cervix: Secondary | ICD-10-CM

## 2022-07-19 DIAGNOSIS — Z23 Encounter for immunization: Secondary | ICD-10-CM | POA: Diagnosis not present

## 2022-07-19 DIAGNOSIS — E785 Hyperlipidemia, unspecified: Secondary | ICD-10-CM

## 2022-07-19 DIAGNOSIS — E1169 Type 2 diabetes mellitus with other specified complication: Secondary | ICD-10-CM

## 2022-07-19 DIAGNOSIS — I10 Essential (primary) hypertension: Secondary | ICD-10-CM

## 2022-07-19 DIAGNOSIS — E119 Type 2 diabetes mellitus without complications: Secondary | ICD-10-CM

## 2022-07-19 DIAGNOSIS — Z7984 Long term (current) use of oral hypoglycemic drugs: Secondary | ICD-10-CM

## 2022-07-19 DIAGNOSIS — Z800 Family history of malignant neoplasm of digestive organs: Secondary | ICD-10-CM

## 2022-07-19 DIAGNOSIS — Z0000 Encounter for general adult medical examination without abnormal findings: Secondary | ICD-10-CM

## 2022-07-19 LAB — POCT GLYCOSYLATED HEMOGLOBIN (HGB A1C): Hemoglobin A1C: 8 % — AB (ref 4.0–5.6)

## 2022-07-19 LAB — GLUCOSE, CAPILLARY: Glucose-Capillary: 217 mg/dL — ABNORMAL HIGH (ref 70–99)

## 2022-07-19 MED ORDER — MOUNJARO 5 MG/0.5ML ~~LOC~~ SOAJ
5.0000 mg | SUBCUTANEOUS | 3 refills | Status: DC
  Filled 2022-07-19: qty 2, 28d supply, fill #0

## 2022-07-19 MED ORDER — LOSARTAN POTASSIUM 25 MG PO TABS
25.0000 mg | ORAL_TABLET | Freq: Every day | ORAL | 11 refills | Status: DC
  Filled 2022-07-19: qty 30, 30d supply, fill #0
  Filled 2022-08-21: qty 30, 30d supply, fill #1
  Filled 2022-11-06: qty 30, 30d supply, fill #2
  Filled 2022-12-17: qty 30, 30d supply, fill #3
  Filled 2023-01-16: qty 30, 30d supply, fill #4
  Filled 2023-02-17: qty 30, 30d supply, fill #5
  Filled 2023-03-21: qty 30, 30d supply, fill #6
  Filled 2023-04-21 – 2023-06-24 (×2): qty 30, 30d supply, fill #7

## 2022-07-19 MED ORDER — MOUNJARO 2.5 MG/0.5ML ~~LOC~~ SOAJ
2.5000 mg | SUBCUTANEOUS | 0 refills | Status: DC
  Filled 2022-07-19: qty 2, 28d supply, fill #0

## 2022-07-19 NOTE — Assessment & Plan Note (Addendum)
Patient continues on pravastatin 40 mg daily.  Last lipids were checked roughly 3 years ago with a total cholesterol 177, HDL 31, and LDL of 80.  Unfortunately the patient left before we could get another blood draw for lipids today.  We will recheck them at her follow-up in 3 months. -Continue pravastatin 40 mg daily - Repeat lipid panel at next visit

## 2022-07-19 NOTE — Progress Notes (Signed)
   CC: Follow-up diabetes  HPI:  [name] is a 56 y.o. female with history as below who presents for a follow-up visit.  Please see encounters tab for problem-based charting  Past Medical History:  Diagnosis Date   Diabetes mellitus without complication ([place])    Hyperlipidemia    Review of Systems:   A comprehensive review of systems was negative.   Physical Exam:  Vitals:   07/19/22 0946  BP: (!) 141/74  Pulse: 62  Temp: 97.6 F (36.4 C)  TempSrc: Oral  SpO2: 100%  Weight: 144 lb 14.4 oz (65.7 kg)   Constitutional: Obese female sitting in a chair. In no acute distress. HENT: Normocephalic, atraumatic,  Eyes: Sclera non-icteric, PERRL, EOM intact Cardio:Regular rate and rhythm. No murmurs, rubs, or gallops. 2+ bilateral radial and dorsalis pedis  pulses. Pulm:Clear to auscultation bilaterally. Normal work of breathing on room air. Abdomen: Soft, non-tender, non-distended, positive bowel sounds. IOM:BTDHRCBU for extremity edema. Skin:Warm and dry. Neuro:Alert and oriented x3. No focal deficit noted. Psych:Pleasant mood and affect.   Assessment & Plan:   See Encounters Tab for problem based charting.  Patient seen with Dr. Evette Doffing

## 2022-07-19 NOTE — Patient Instructions (Addendum)
Carly [name]. Carly [name], por permitirnos brindarle atencin hoy. Hoy discutimos. . .  > Diabetes        -Tu A1c hoy fue 8.0. Le comenzaremos con el medicamento Mounjaro, que tomar 2,5 mg por semana durante 4 semanas y luego aumentar a la dosis ms alta de 5 mg por semana. Las recetas se redactarn de manera que pueda recibir la dosis ms alta dentro de 4 semanas. Contine tomando metformina segn lo recetado y volveremos a controlar su A1c en 3 meses. Tambin hemos enviado otra referencia para su examen de la vista para diabticos y deberan llamarlo para programar una cita. Si tiene algn problema con esto, no dude en llamar a nuestra oficina para obtener ayuda. > Hipertensin        -Su presin arterial estuvo alta durante mltiples controles hoy en 146/71. Esto no es [address], pero nos gustara mantenerlo bajo control. Le comenzaremos con 25 mg de losartn al [employer]. Si tiene [place] sensacin de debilidad, mareos o si se va a Artist, deje de Designer, industrial/product y llame a nuestra clnica. > Mantenimiento sanitario        -Discutimos los [place] de tu colonoscopia en julio y tu mamografa en octubre. Tambin revis su prueba de deteccin de cncer de cuello uterino con los [place] de su prueba de [place]apan[place] de [place] de 2022. Deber repetir Neomia Dear colonoscopia en julio de 2028. Deber repetir la prueba de deteccin de cncer de cuello uterino en octubre de 2027. Y continuar hacindose mamografas de deteccin anuales con el prximo, previsto para [place] de 2024.   He ordenado los siguientes laboratorios para usted:  rdenes de Engineering geologist microalbmina/creatinina en orina       Perfil lipdico       POC Hbg A1C   Pruebas ordenadas hoy:  Ninguno   Referencias ordenadas hoy:  rdenes de referencia       Derivacin ambulatoria a Oftalmologa     He ordenado/cambiado los siguientes medicamentos:  Suspenda los siguientes  medicamentos: No hay medicamentos discontinuados.  Inicie los siguientes medicamentos: Los mdicos ordenaron este encuentro Medicamentos  tirzepatida (MOUNJARO) 2,5 MG/0,5 ML pluma Sig: Inyectar 2,5 mg en la piel una vez por semana. Dispensar: 2 mL Recarga: 0  tirzepatida (MOUNJARO) 5 MG/0,5 ML pluma Sig: Inyectar 5 mg en la piel una vez por semana. Dispensar: 2 mL Recarga: 3  tableta de losartn (COZAAR) de 25 mg Sig: Tome 1 tableta (25 mg en total) por va oral al da. Dispensacin: 30 comprimidos Recarga: 11     Seguimiento: 3 meses   Recordar:   Si tiene alguna pregunta o inquietud, llame a la clnica de medicina interna al [phone].   Carly Morel, DO [facility]  Thank you, Carly [name], for allowing [place] to provide your care today. Today we discussed . . .  > Diabetes       -Your A1c today was 8.0.  We will start you on the [facility] medication which she will take 2.5 mg weekly for 4 weeks and then increase to the higher dose of 5 mg weekly.  The prescriptions will be written such that you can pick up the higher dose 4 weeks from now.  Please continue to take your metformin as prescribed and we will recheck your A1c in 3 months.  We have also sent in another referral for your diabetic eye exam and  they should call you to schedule.  You have any issues with this please do not hesitate to call our office for assistance. > Hypertension       -Your blood pressure was high during multiple checks today at 146/71.  This is not severely high but we would like to keep it under control.  We will start you on losartan 25 mg daily.  If you have any feelings of weakness, dizziness, or for like you are going to pass out please stop taking the medication and call our clinic. [name] maintenance       -We discussed the results of your colonoscopy and [name] and your mammogram in October.  I also reviewed your cervical cancer screening  with Pap smear results from October 2022.  You will be due to repeat a colonoscopy in [name] 2028.  You will be due to repeat cervical cancer screening in October 2027.  And you will continue to get yearly screening mammograms with the next one due in October 2024.   I have ordered the following labs for you:  Lab Orders         Glucose, capillary         Microalbumin / Creatinine Urine Ratio         Lipid Profile         POC Hbg A1C       Tests ordered today:  None   Referrals ordered today:   Referral Orders         Ambulatory referral to Ophthalmology        I have ordered the following medication/changed the following medications:   Stop the following medications: There are no discontinued medications.   Start the following medications: Meds ordered this encounter  Medications   tirzepatide (MOUNJARO) 2.5 MG/0.5ML Pen    Sig: Inject 2.5 mg into the skin once a week.    Dispense:  2 mL    Refill:  0   tirzepatide (MOUNJARO) 5 MG/0.5ML Pen    Sig: Inject 5 mg into the skin once a week.    Dispense:  2 mL    Refill:  3   losartan (COZAAR) 25 MG tablet    Sig: Take 1 tablet (25 mg total) by mouth daily.    Dispense:  30 tablet    Refill:  11      Follow up: 3 months    Remember:     Should you have any questions or concerns please call the internal medicine clinic at [phone].     Carly [name], [place]

## 2022-07-19 NOTE — Assessment & Plan Note (Addendum)
Colonoscopy on 04/23/2022 with benign polyps removed and an otherwise normal colonoscopy.  Repeat in 5 years. Mammogram on 07/11/2022 without evidence of malignancy repeat screening mammogram 1 year. Flu vaccine today.

## 2022-07-19 NOTE — Assessment & Plan Note (Signed)
Patient with multiple blood pressure readings today 140s over 70s.  Looking through her history she has had these readings before with some even in the 150s.  She does not check her blood pressure at home.  Due to her underlying diabetes and the multiple high blood pressure readings that we have had so far we will start her on losartan. - Start losartan 25 mg daily

## 2022-07-19 NOTE — Progress Notes (Signed)
The results of her capillary glucose and her hemoglobin A1c were discussed with the patient during her office visit today.  All questions were answered.

## 2022-07-19 NOTE — Assessment & Plan Note (Addendum)
A1c today of 8.0 which is up from 7.1 in 03/2022.  Capillary glucose of 217.  At that time patient was supposed to start on [facility] and was continued on metformin 1000 mg twice daily.  She was unable to pick up the [facility] due to price and has not had this medication in the past 3 months.  She also notes that her diet has not been as good as it used to be.  These both can explain her rise in her A1c.  Since she has inadequate control on metformin alone we will again add Mounjaro. - Start Mounjaro 2.5 mg weekly for 4 weeks and increase to 5 mg weekly after that - Continue metformin 1000 mg twice daily - Return in 3 months for repeat A1c

## 2022-07-20 LAB — MICROALBUMIN / CREATININE URINE RATIO
Creatinine, Urine: 175.8 mg/dL
Microalb/Creat Ratio: 12 mg/g creat (ref 0–29)
Microalbumin, Urine: 21.6 ug/mL

## 2022-07-22 ENCOUNTER — Encounter: Payer: Self-pay | Admitting: Student

## 2022-07-22 ENCOUNTER — Other Ambulatory Visit (HOSPITAL_COMMUNITY): Payer: Self-pay

## 2022-07-22 NOTE — Progress Notes (Signed)
Internal Medicine Clinic Attending  I saw and evaluated the patient.  I personally confirmed the key portions of the history and exam documented by Dr. Goodwin and I reviewed pertinent patient test results.  The assessment, diagnosis, and plan were formulated together and I agree with the documentation in the resident's note.  

## 2022-07-22 NOTE — Progress Notes (Signed)
Results of capillary glucose and hemoglobin A1c were discussed with the patient during her visit.  Her microalbumin creatinine ratio came back normal.  All of these results were mailed to the patient as a letter with Spanish translation.

## 2022-07-24 ENCOUNTER — Other Ambulatory Visit (HOSPITAL_COMMUNITY): Payer: Self-pay

## 2022-07-24 ENCOUNTER — Other Ambulatory Visit: Payer: Self-pay | Admitting: Student

## 2022-07-24 DIAGNOSIS — E119 Type 2 diabetes mellitus without complications: Secondary | ICD-10-CM

## 2022-07-24 MED ORDER — TRULICITY 0.75 MG/0.5ML ~~LOC~~ SOAJ
0.7500 mg | SUBCUTANEOUS | 3 refills | Status: DC
  Filled 2022-07-24: qty 2, 28d supply, fill #0

## 2022-07-24 NOTE — Progress Notes (Signed)
Rx switch from [facility] to Trulicity due to cost.  Attempted to reach the patient using a telephonic interpreter but was unable to reach them.  Message left on voicemail to call the office back.

## 2022-08-02 ENCOUNTER — Other Ambulatory Visit (HOSPITAL_COMMUNITY): Payer: Self-pay

## 2022-08-20 ENCOUNTER — Other Ambulatory Visit: Payer: Self-pay

## 2022-08-20 DIAGNOSIS — E1169 Type 2 diabetes mellitus with other specified complication: Secondary | ICD-10-CM

## 2022-08-20 DIAGNOSIS — E119 Type 2 diabetes mellitus without complications: Secondary | ICD-10-CM

## 2022-08-20 DIAGNOSIS — I10 Essential (primary) hypertension: Secondary | ICD-10-CM

## 2022-08-21 ENCOUNTER — Other Ambulatory Visit (HOSPITAL_COMMUNITY): Payer: Self-pay

## 2022-08-22 MED ORDER — LOSARTAN POTASSIUM 25 MG PO TABS: 25.0000 mg | ORAL_TABLET | Freq: Every day | ORAL | 2 refills | Status: DC

## 2022-08-22 MED ORDER — PRAVASTATIN SODIUM 40 MG PO TABS: 40.0000 mg | ORAL_TABLET | Freq: Every day | ORAL | 3 refills | Status: DC

## 2022-08-22 MED ORDER — TRULICITY 0.75 MG/0.5ML ~~LOC~~ SOAJ: 0.7500 mg | SUBCUTANEOUS | 3 refills | Status: DC

## 2022-08-22 MED ORDER — METFORMIN HCL 500 MG PO TABS: 1000.0000 mg | ORAL_TABLET | Freq: Two times a day (BID) | ORAL | 5 refills | Status: DC

## 2022-08-22 NOTE — Telephone Encounter (Signed)
Approved refills. Please have this patient schedule 3 month follow up. Thanks.

## 2022-11-06 ENCOUNTER — Other Ambulatory Visit (HOSPITAL_COMMUNITY): Payer: Self-pay

## 2022-11-11 LAB — HM DIABETES EYE EXAM

## 2022-11-19 ENCOUNTER — Encounter: Payer: Commercial Managed Care - HMO | Admitting: Student

## 2022-11-25 ENCOUNTER — Encounter: Payer: Self-pay | Admitting: Dietician

## 2022-12-11 ENCOUNTER — Encounter: Payer: Self-pay | Admitting: Internal Medicine

## 2022-12-17 ENCOUNTER — Other Ambulatory Visit (HOSPITAL_COMMUNITY): Payer: Self-pay

## 2022-12-17 ENCOUNTER — Other Ambulatory Visit: Payer: Self-pay

## 2022-12-18 ENCOUNTER — Encounter: Payer: Self-pay | Admitting: Student

## 2023-01-16 ENCOUNTER — Other Ambulatory Visit: Payer: Self-pay | Admitting: Internal Medicine

## 2023-01-16 ENCOUNTER — Other Ambulatory Visit (HOSPITAL_COMMUNITY): Payer: Self-pay

## 2023-01-16 DIAGNOSIS — E1169 Type 2 diabetes mellitus with other specified complication: Secondary | ICD-10-CM

## 2023-01-17 ENCOUNTER — Other Ambulatory Visit (HOSPITAL_COMMUNITY): Payer: Self-pay

## 2023-01-17 MED ORDER — PRAVASTATIN SODIUM 40 MG PO TABS
40.0000 mg | ORAL_TABLET | Freq: Every day | ORAL | 3 refills | Status: DC
  Filled 2023-01-17: qty 90, 90d supply, fill #0
  Filled 2023-04-21: qty 90, 90d supply, fill #1

## 2023-02-17 ENCOUNTER — Other Ambulatory Visit (HOSPITAL_COMMUNITY): Payer: Self-pay

## 2023-03-21 ENCOUNTER — Other Ambulatory Visit (HOSPITAL_COMMUNITY): Payer: Self-pay

## 2023-04-21 ENCOUNTER — Other Ambulatory Visit: Payer: Self-pay | Admitting: Student

## 2023-04-21 ENCOUNTER — Other Ambulatory Visit (HOSPITAL_COMMUNITY): Payer: Self-pay

## 2023-04-21 DIAGNOSIS — E119 Type 2 diabetes mellitus without complications: Secondary | ICD-10-CM

## 2023-04-22 ENCOUNTER — Other Ambulatory Visit (HOSPITAL_COMMUNITY): Payer: Self-pay

## 2023-04-22 NOTE — Telephone Encounter (Signed)
Pt has active metformin prescription

## 2023-05-01 ENCOUNTER — Other Ambulatory Visit (HOSPITAL_COMMUNITY): Payer: Self-pay

## 2023-05-23 ENCOUNTER — Other Ambulatory Visit: Payer: Self-pay

## 2023-05-23 ENCOUNTER — Encounter: Payer: Self-pay | Admitting: Student

## 2023-05-23 ENCOUNTER — Other Ambulatory Visit (HOSPITAL_COMMUNITY): Payer: Self-pay

## 2023-05-23 ENCOUNTER — Ambulatory Visit (INDEPENDENT_AMBULATORY_CARE_PROVIDER_SITE_OTHER): Payer: BLUE CROSS/BLUE SHIELD | Admitting: Student

## 2023-05-23 VITALS — BP 126/59 | HR 60 | Temp 98.1°F | Ht 60.0 in | Wt 141.2 lb

## 2023-05-23 DIAGNOSIS — E119 Type 2 diabetes mellitus without complications: Secondary | ICD-10-CM | POA: Diagnosis not present

## 2023-05-23 DIAGNOSIS — E785 Hyperlipidemia, unspecified: Secondary | ICD-10-CM | POA: Diagnosis not present

## 2023-05-23 DIAGNOSIS — E1169 Type 2 diabetes mellitus with other specified complication: Secondary | ICD-10-CM | POA: Diagnosis not present

## 2023-05-23 DIAGNOSIS — I10 Essential (primary) hypertension: Secondary | ICD-10-CM | POA: Diagnosis not present

## 2023-05-23 DIAGNOSIS — Z7984 Long term (current) use of oral hypoglycemic drugs: Secondary | ICD-10-CM

## 2023-05-23 LAB — POCT GLYCOSYLATED HEMOGLOBIN (HGB A1C): Hemoglobin A1C: 9 % — AB (ref 4.0–5.6)

## 2023-05-23 LAB — GLUCOSE, CAPILLARY: Glucose-Capillary: 296 mg/dL — ABNORMAL HIGH (ref 70–99)

## 2023-05-23 MED ORDER — METFORMIN HCL 500 MG PO TABS
1000.0000 mg | ORAL_TABLET | Freq: Two times a day (BID) | ORAL | 5 refills | Status: DC
  Filled 2023-05-23: qty 120, 30d supply, fill #0

## 2023-05-23 MED ORDER — PIOGLITAZONE HCL 15 MG PO TABS
15.0000 mg | ORAL_TABLET | Freq: Every day | ORAL | 3 refills | Status: AC
  Filled 2023-05-23: qty 90, 90d supply, fill #0
  Filled 2023-08-20: qty 90, 90d supply, fill #1
  Filled 2023-11-17: qty 90, 90d supply, fill #2
  Filled 2024-03-05: qty 90, 90d supply, fill #3

## 2023-05-23 MED ORDER — METFORMIN HCL 1000 MG PO TABS
1000.0000 mg | ORAL_TABLET | Freq: Two times a day (BID) | ORAL | 3 refills | Status: AC
Start: 2023-05-23 — End: ?
  Filled 2023-05-23: qty 180, 90d supply, fill #0
  Filled 2023-08-20: qty 180, 90d supply, fill #1
  Filled 2023-12-08: qty 180, 90d supply, fill #2

## 2023-05-23 MED ORDER — PRAVASTATIN SODIUM 40 MG PO TABS
40.0000 mg | ORAL_TABLET | Freq: Every day | ORAL | 3 refills | Status: AC
  Filled 2023-05-23 (×2): qty 90, 90d supply, fill #0
  Filled 2023-08-20: qty 90, 90d supply, fill #1
  Filled 2023-11-17: qty 90, 90d supply, fill #2

## 2023-05-23 NOTE — Assessment & Plan Note (Signed)
Patient presents with a history of hypertension currently well-controlled on losartan 25 mg.  The patient stated that she will forget to take the medications "here and there".  We discussed the use of a pillbox to avoid taking extra doses and allowing her to keep track of doses that she might miss.  Blood pressure during appointment was 126/59. Plan -Continue losartan 25 mg -BMP ordered for today

## 2023-05-23 NOTE — Assessment & Plan Note (Signed)
Patient presents with a history of uncontrolled type 2 diabetes mellitus currently on a monotherapy of metformin 2000 mg a day.  During the last office visit, Greggory Keen was ordered but due to cost it was changed to Trulicity.  Unfortunately, Trulicity was too expensive and the patient remains on metformin 2000 mg as a monotherapy.  Her A1c has increased from 8 in 2023-9.0 today.  She also endorsed decrease in an exercise and increase in an unhealthy diet.  Uncontrolled diabetes is multifactorial due to need for additional medication and lifestyle. Plan -Continue metformin 2000 mg -Begin pioglitazone 15 mg -Patient was instructed to avoid excessive salt in diet with pioglitazone -Foot exam performed today -Referral sent for Lupita Leash, patient encouraged new Lupita Leash before the next office visit -Follow-up in 3 months, check A1c, check urine microalbumin creatinine ratio at next visit

## 2023-05-23 NOTE — Assessment & Plan Note (Signed)
Patient has a history of hyperlipidemia currently on pravastatin 40 mg.  The patient has not had a lipid panel with an LDL since 2019.  Due to current diagnosis of type 2 diabetes mellitus, she will need a repeat lipid panel and a new  calculated ASCVD risk score. Plan -Repeat lipid panel today -Depending on the ASCVD score, consider increasing intensity of statin.

## 2023-05-23 NOTE — Progress Notes (Signed)
Established Patient Office Visit  Subjective   Patient ID: Carly [name], female    DOB: December 04, 1965  Age: 57 y.o. MRN: [id number]  Chief Complaint  Patient presents with   Follow-up   Diabetes   Hypertension    Carly [name] is a 57 year old female with a past medical history of type 2 diabetes mellitus, hypertension, hyperlipidemia who presents to the office today for a follow-up on type 2 diabetes mellitus. Please see problem based assessment and plan for additional details.     Past Medical History:  Diagnosis Date   Diabetes mellitus without complication (HCC)    Hyperlipidemia       Review of Systems  Endo/Heme/Allergies:        Polyuria Polydipsia     Objective:     BP (!) 126/59 (BP Location: Left Arm, Patient Position: Sitting, Cuff Size: Large)   Pulse 60   Temp 98.1 F (36.7 C) (Oral)   Ht 5' (1.524 m)   Wt 141 lb 3.2 oz (64 kg)   LMP 02/19/2014   SpO2 99%   BMI 27.58 kg/m  BP Readings from Last 3 Encounters:  05/23/23 (!) 126/59  07/19/22 (!) 146/71  04/23/22 124/61      Physical Exam Constitutional:      General: She is not in acute distress.    Appearance: She is overweight. She is not ill-appearing.  Cardiovascular:     Rate and Rhythm: Normal rate and regular rhythm.     Heart sounds: Normal heart sounds. No murmur heard. Pulmonary:     Effort: Pulmonary effort is normal.     Breath sounds: Normal breath sounds.  Skin:    General: Skin is warm and dry.     Coloration: Skin is not jaundiced.  Neurological:     Mental Status: She is alert.      Results for orders placed or performed in visit on 05/23/23  Glucose, capillary  Result Value Ref Range   Glucose-Capillary 296 (H) 70 - 99 mg/dL  POC Hbg N8G  Result Value Ref Range   Hemoglobin A1C 9.0 (A) 4.0 - 5.6 %   HbA1c POC (<> result, manual entry)     HbA1c, POC (prediabetic range)     HbA1c, POC (controlled diabetic range)      Last metabolic panel Lab  Results  Component Value Date   GLUCOSE 151 (H) 02/14/2022   NA 136 02/14/2022   K 5.0 02/14/2022   CL 100 02/14/2022   CO2 27 02/14/2022   BUN 13 02/14/2022   CREATININE 0.69 02/14/2022   GFR 96.50 02/14/2022   CALCIUM 9.7 02/14/2022   PROT 7.5 02/14/2022   ALBUMIN 4.7 02/14/2022   BILITOT 0.6 02/14/2022   ALKPHOS 101 02/14/2022   AST 14 02/14/2022   ALT 18 02/14/2022   Last lipids Lab Results  Component Value Date   CHOL 177 06/23/2019   HDL 31 (L) 06/23/2019   LDLCALC 80 06/23/2019   TRIG 413 (H) 06/23/2019   CHOLHDL 4.6 03/20/2018      The ASCVD Risk score (Arnett DK, et al., 2019) failed to calculate for the following reasons:   Cannot find a previous HDL lab   Cannot find a previous total cholesterol lab    Assessment & Plan:   Problem List Items Addressed This Visit       Cardiovascular and Mediastinum   Hypertension    Patient presents with a history of hypertension currently well-controlled on losartan 25 mg.  The  patient stated that she will forget to take the medications "here and there".  We discussed the use of a pillbox to avoid taking extra doses and allowing her to keep track of doses that she might miss.  Blood pressure during appointment was 126/59. Plan -Continue losartan 25 mg -BMP ordered for today      Relevant Medications   pravastatin (PRAVACHOL) 40 MG tablet   Other Relevant Orders   BMP8+Anion Gap     Endocrine   Type 2 diabetes mellitus (HCC) - Primary    Patient presents with a history of uncontrolled type 2 diabetes mellitus currently on a monotherapy of metformin 2000 mg a day.  During the last office visit, Greggory Keen was ordered but due to cost it was changed to Trulicity.  Unfortunately, Trulicity was too expensive and the patient remains on metformin 2000 mg as a monotherapy.  Her A1c has increased from 8 in 2023-9.0 today.  She also endorsed decrease in an exercise and increase in an unhealthy diet.  Uncontrolled diabetes is  multifactorial due to need for additional medication and lifestyle. Plan -Continue metformin 2000 mg -Begin pioglitazone 15 mg -Patient was instructed to avoid excessive salt in diet with pioglitazone -Foot exam performed today -Referral sent for Lupita Leash, patient encouraged new Lupita Leash before the next office visit -Follow-up in 3 months, check A1c, check urine microalbumin creatinine ratio at next visit       Relevant Medications   pravastatin (PRAVACHOL) 40 MG tablet   pioglitazone (ACTOS) 15 MG tablet   metFORMIN (GLUCOPHAGE) 1000 MG tablet   Other Relevant Orders   POC Hbg A1C (Completed)   Referral to Nutrition and Diabetes Services   Hyperlipidemia associated with type 2 diabetes mellitus (HCC)    Patient has a history of hyperlipidemia currently on pravastatin 40 mg.  The patient has not had a lipid panel with an LDL since 2019.  Due to current diagnosis of type 2 diabetes mellitus, she will need a repeat lipid panel and a new  calculated ASCVD risk score. Plan -Repeat lipid panel today -Depending on the ASCVD score, consider increasing intensity of statin.      Relevant Medications   pravastatin (PRAVACHOL) 40 MG tablet   pioglitazone (ACTOS) 15 MG tablet   metFORMIN (GLUCOPHAGE) 1000 MG tablet   Other Relevant Orders   Lipid Profile        Return in about 3 months (around 08/23/2023) for diabetes .    Faith Rogue, DO

## 2023-05-23 NOTE — Progress Notes (Signed)
Patient notified of results during appointment

## 2023-05-23 NOTE — Patient Instructions (Addendum)
Tenga en cuenta que cambiamos la dosis de metformina a 1000 mg [employer]. Tomar UNA pastilla por la maana y UNA pastilla por la noche.  Por favor limite la sal en la dieta. Por favor comience a hacer ejercicio.

## 2023-05-26 LAB — LIPID PANEL
Chol/HDL Ratio: 5.3 ratio — ABNORMAL HIGH (ref 0.0–4.4)
Cholesterol, Total: 170 mg/dL (ref 100–199)
HDL: 32 mg/dL — ABNORMAL LOW (ref >39)
LDL Chol Calc (NIH): 63 mg/dL (ref 0–99)
Triglycerides: 489 mg/dL — ABNORMAL HIGH (ref 0–149)
VLDL Cholesterol Cal: 75 mg/dL — ABNORMAL HIGH (ref 5–40)

## 2023-05-26 LAB — BMP8+ANION GAP
Anion Gap: 16 mmol/L (ref 10.0–18.0)
BUN/Creatinine Ratio: 13 (ref 9–23)
BUN: 10 mg/dL (ref 6–24)
CO2: 22 mmol/L (ref 20–29)
Calcium: 9.6 mg/dL (ref 8.7–10.2)
Chloride: 97 mmol/L (ref 96–106)
Creatinine, Ser: 0.77 mg/dL (ref 0.57–1.00)
Glucose: 319 mg/dL — ABNORMAL HIGH (ref 70–99)
Potassium: 4.7 mmol/L (ref 3.5–5.2)
Sodium: 135 mmol/L (ref 134–144)
eGFR: 89 mL/min/{1.73_m2} (ref >59)

## 2023-05-26 NOTE — Progress Notes (Signed)
Contacted the patient on the phone.  She was notified that her BMP is stable, her LDL is 63.  Her triglycerides are elevated at 489, consider fasting lipid profile in future for hypertriglyceridemia.

## 2023-05-28 NOTE — Progress Notes (Signed)
Internal Medicine Clinic Attending  I was physically present during the key portions of the resident provided service and participated in the medical decision making of patient's management care. I reviewed pertinent patient test results.  The assessment, diagnosis, and plan were formulated together and I agree with the documentation in the resident's note.  Gust Rung, DO

## 2023-06-24 ENCOUNTER — Other Ambulatory Visit (HOSPITAL_COMMUNITY): Payer: Self-pay

## 2023-06-24 ENCOUNTER — Other Ambulatory Visit: Payer: Self-pay

## 2023-07-31 ENCOUNTER — Other Ambulatory Visit (HOSPITAL_COMMUNITY): Payer: Self-pay

## 2023-07-31 ENCOUNTER — Other Ambulatory Visit: Payer: Self-pay

## 2023-07-31 ENCOUNTER — Other Ambulatory Visit: Payer: Self-pay | Admitting: Student

## 2023-07-31 DIAGNOSIS — I10 Essential (primary) hypertension: Secondary | ICD-10-CM

## 2023-07-31 MED ORDER — LOSARTAN POTASSIUM 25 MG PO TABS
25.0000 mg | ORAL_TABLET | Freq: Every day | ORAL | 11 refills | Status: AC
  Filled 2023-07-31 (×2): qty 30, 30d supply, fill #0
  Filled 2023-09-02: qty 30, 30d supply, fill #1
  Filled 2023-10-16: qty 30, 30d supply, fill #2
  Filled 2023-11-17: qty 30, 30d supply, fill #3
  Filled 2023-12-24: qty 30, 30d supply, fill #4
  Filled 2024-01-29: qty 30, 30d supply, fill #5

## 2023-08-20 ENCOUNTER — Other Ambulatory Visit: Payer: Self-pay

## 2023-09-02 ENCOUNTER — Other Ambulatory Visit (HOSPITAL_COMMUNITY): Payer: Self-pay

## 2023-10-15 ENCOUNTER — Other Ambulatory Visit (HOSPITAL_COMMUNITY): Payer: Self-pay

## 2023-10-15 MED ORDER — JARDIANCE 10 MG PO TABS
10.0000 mg | ORAL_TABLET | Freq: Every day | ORAL | 3 refills | Status: DC
  Filled 2023-10-15: qty 90, 90d supply, fill #0

## 2023-10-16 ENCOUNTER — Other Ambulatory Visit (HOSPITAL_COMMUNITY): Payer: Self-pay

## 2023-10-16 ENCOUNTER — Other Ambulatory Visit: Payer: Self-pay

## 2023-10-17 ENCOUNTER — Other Ambulatory Visit (HOSPITAL_COMMUNITY): Payer: Self-pay

## 2023-11-17 ENCOUNTER — Other Ambulatory Visit: Payer: Self-pay

## 2023-11-17 ENCOUNTER — Other Ambulatory Visit (HOSPITAL_COMMUNITY): Payer: Self-pay

## 2023-12-08 ENCOUNTER — Other Ambulatory Visit (HOSPITAL_COMMUNITY): Payer: Self-pay

## 2023-12-09 ENCOUNTER — Other Ambulatory Visit (HOSPITAL_COMMUNITY): Payer: Self-pay

## 2023-12-24 ENCOUNTER — Other Ambulatory Visit (HOSPITAL_COMMUNITY): Payer: Self-pay

## 2024-01-29 ENCOUNTER — Other Ambulatory Visit (HOSPITAL_COMMUNITY): Payer: Self-pay

## 2024-03-03 ENCOUNTER — Other Ambulatory Visit (HOSPITAL_COMMUNITY): Payer: Self-pay

## 2024-03-03 MED ORDER — LOSARTAN POTASSIUM 25 MG PO TABS
25.0000 mg | ORAL_TABLET | Freq: Every day | ORAL | 3 refills | Status: AC
  Filled 2024-03-03 (×2): qty 90, 90d supply, fill #0
  Filled 2024-06-09: qty 90, 90d supply, fill #1
  Filled 2024-09-20: qty 90, 90d supply, fill #2

## 2024-03-03 MED ORDER — PRAVASTATIN SODIUM 40 MG PO TABS
40.0000 mg | ORAL_TABLET | Freq: Every day | ORAL | 4 refills | Status: AC
  Filled 2024-03-03: qty 90, 90d supply, fill #0
  Filled 2024-06-09: qty 90, 90d supply, fill #1
  Filled 2024-09-20: qty 90, 90d supply, fill #2

## 2024-03-03 MED ORDER — METFORMIN HCL 1000 MG PO TABS
1000.0000 mg | ORAL_TABLET | Freq: Two times a day (BID) | ORAL | 3 refills | Status: AC
  Filled 2024-03-03 (×2): qty 180, 90d supply, fill #0
  Filled 2024-06-09: qty 180, 90d supply, fill #1
  Filled 2024-09-20: qty 180, 90d supply, fill #2

## 2024-03-05 ENCOUNTER — Other Ambulatory Visit (HOSPITAL_COMMUNITY): Payer: Self-pay

## 2024-03-12 ENCOUNTER — Other Ambulatory Visit (HOSPITAL_COMMUNITY): Payer: Self-pay

## 2024-06-09 ENCOUNTER — Other Ambulatory Visit (HOSPITAL_COMMUNITY): Payer: Self-pay

## 2024-06-15 ENCOUNTER — Other Ambulatory Visit (HOSPITAL_COMMUNITY): Payer: Self-pay

## 2024-09-20 ENCOUNTER — Other Ambulatory Visit (HOSPITAL_COMMUNITY): Payer: Self-pay

## 2024-09-20 ENCOUNTER — Other Ambulatory Visit: Payer: Self-pay

## 2024-09-21 ENCOUNTER — Other Ambulatory Visit (HOSPITAL_COMMUNITY): Payer: Self-pay

## 2024-09-23 ENCOUNTER — Other Ambulatory Visit (HOSPITAL_COMMUNITY): Payer: Self-pay

## 2024-09-23 MED ORDER — JANUVIA 100 MG PO TABS
100.0000 mg | ORAL_TABLET | Freq: Every day | ORAL | 3 refills | Status: AC
  Filled 2024-09-23: qty 90, 90d supply, fill #0

## 2024-10-05 ENCOUNTER — Other Ambulatory Visit (HOSPITAL_COMMUNITY): Payer: Self-pay
